# Patient Record
Sex: Female | Born: 1995 | Race: White | Hispanic: No | Marital: Single | Smoking: Former smoker
Health system: Southern US, Community
[De-identification: ages and names within clinical notes are randomized; demographics above are authoritative.]

## PROBLEM LIST (undated history)

## (undated) ENCOUNTER — Inpatient Hospital Stay (HOSPITAL_COMMUNITY): Payer: Self-pay

## (undated) DIAGNOSIS — F99 Mental disorder, not otherwise specified: Secondary | ICD-10-CM

## (undated) DIAGNOSIS — G43909 Migraine, unspecified, not intractable, without status migrainosus: Secondary | ICD-10-CM

## (undated) DIAGNOSIS — F32A Depression, unspecified: Secondary | ICD-10-CM

## (undated) DIAGNOSIS — F419 Anxiety disorder, unspecified: Secondary | ICD-10-CM

## (undated) DIAGNOSIS — O24419 Gestational diabetes mellitus in pregnancy, unspecified control: Secondary | ICD-10-CM

## (undated) HISTORY — PX: NO PAST SURGERIES: SHX2092

## (undated) HISTORY — DX: Mental disorder, not otherwise specified: F99

---
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
## 2016-06-15 NOTE — L&D Delivery Note (Signed)
Delivery Note Pt became complete at 2100 and pushed off and on with varying effectiveness, and at 12:06 AM a viable female was delivered via Vaginal, Spontaneous Delivery (Presentation: OA ). Nuchal x 1 reduced prior to delivery.  APGAR: 9, 9; weight: pending.  Infant dried and lifted to pt's abd; cord clamped and cut by FOB. Hospital cord blood sample collected. Placenta status: spont , intact .  Cord: 3 vessel  Anesthesia:  Epidural Episiotomy:  None Lacerations:  None Est. Blood Loss (mL):  100  Mom to postpartum.  Baby to Couplet care / Skin to Skin.  Cam HaiSHAW, [place]hower CNM 02/13/2017, 12:23 AM

## 2016-08-20 ENCOUNTER — Encounter: Payer: Self-pay | Admitting: Obstetrics & Gynecology

## 2016-08-20 ENCOUNTER — Ambulatory Visit (INDEPENDENT_AMBULATORY_CARE_PROVIDER_SITE_OTHER): Payer: Medicaid Other | Admitting: Obstetrics & Gynecology

## 2016-08-20 ENCOUNTER — Other Ambulatory Visit (HOSPITAL_COMMUNITY)
Admission: RE | Admit: 2016-08-20 | Discharge: 2016-08-20 | Disposition: A | Discharge status: Home / Self Care | Payer: Medicaid Other | Source: Ambulatory Visit | Attending: Obstetrics & Gynecology | Admitting: Obstetrics & Gynecology

## 2016-08-20 DIAGNOSIS — E669 Obesity, unspecified: Secondary | ICD-10-CM

## 2016-08-20 DIAGNOSIS — O99210 Obesity complicating pregnancy, unspecified trimester: Secondary | ICD-10-CM | POA: Insufficient documentation

## 2016-08-20 DIAGNOSIS — Z3689 Encounter for other specified antenatal screening: Secondary | ICD-10-CM

## 2016-08-20 DIAGNOSIS — Z01419 Encounter for gynecological examination (general) (routine) without abnormal findings: Secondary | ICD-10-CM | POA: Diagnosis not present

## 2016-08-20 DIAGNOSIS — Z3402 Encounter for supervision of normal first pregnancy, second trimester: Secondary | ICD-10-CM | POA: Diagnosis not present

## 2016-08-20 DIAGNOSIS — O99212 Obesity complicating pregnancy, second trimester: Secondary | ICD-10-CM

## 2016-08-20 DIAGNOSIS — Z113 Encounter for screening for infections with a predominantly sexual mode of transmission: Secondary | ICD-10-CM | POA: Insufficient documentation

## 2016-08-20 DIAGNOSIS — O0993 Supervision of high risk pregnancy, unspecified, third trimester: Secondary | ICD-10-CM | POA: Insufficient documentation

## 2016-08-20 NOTE — Progress Notes (Signed)
  Subjective:    Patricia Wyatt is a 21 yo SW  G1P0 4531w1d being seen today for her first obstetrical visit.  Her obstetrical history is significant for obesity. Patient does intend to breast feed. Pregnancy history fully reviewed.  Patient reports no complaints.  Vitals:   08/20/16 1100 08/20/16 1102  BP: 125/87   Pulse: 87   Weight: 201 lb (91.2 kg)   Height:  5\' 8"  (1.727 m)    HISTORY: OB History  Gravida Para Term Preterm AB Living  1            SAB TAB Ectopic Multiple Live Births               # Outcome Date GA Lbr Len/2nd Weight Sex Delivery Anes PTL Lv  1 Current              History reviewed. No pertinent past medical history. History reviewed. No pertinent surgical history. Family History  Problem Relation Age of Onset  . Lung cancer Maternal Grandfather      Exam    Uterus:     Pelvic Exam:    Perineum: No Hemorrhoids   Vulva: normal   Vagina:  normal mucosa   pH:    Cervix: anteverted   Adnexa: normal adnexa   Bony Pelvis: android  System: Breast:  normal appearance, no masses or tenderness   Skin: normal coloration and turgor, no rashes    Neurologic: oriented   Extremities: normal strength, tone, and muscle mass   HEENT PERRLA   Mouth/Teeth mucous membranes moist, pharynx normal without lesions   Neck supple   Cardiovascular: regular rate and rhythm   Respiratory:  appears well, vitals normal, no respiratory distress, acyanotic, normal RR, ear and throat exam is normal, neck free of mass or lymphadenopathy, chest clear, no wheezing, crepitations, rhonchi, normal symmetric air entry   Abdomen: soft, non-tender; bowel sounds normal; no masses,  no organomegaly   Urinary: urethral meatus normal      Assessment:    Pregnancy: G1P0 Patient Active Problem List   Diagnosis Date Noted  . Supervision of normal first pregnancy 08/20/2016  . Obesity in pregnancy 08/20/2016        Plan:     Initial labs drawn. Prenatal vitamins. Problem list  reviewed and updated. Genetic Screening discussed Quad Screen: requested.  Ultrasound discussed; fetal survey: ordered.  Follow up in 4 weeks. Baby scripts offered Patricia Wyatt 08/20/2016

## 2016-08-20 NOTE — Progress Notes (Signed)
Abdominal US performed, SIUP noted with + FHR = 158, HC measuring 14w 3d which correlates with LMP.

## 2016-08-20 NOTE — Addendum Note (Signed)
Addended by: Allie BossierVE, Terris Germano C on: 08/20/2016 11:33 AM   Modules accepted: Kipp BroodSmartSet

## 2016-08-21 ENCOUNTER — Encounter: Payer: Self-pay | Admitting: *Deleted

## 2016-08-22 LAB — CULTURE, OB URINE

## 2016-08-22 LAB — URINE CULTURE, OB REFLEX: Organism ID, Bacteria: NO GROWTH

## 2016-08-24 LAB — CYTOLOGY - PAP
Chlamydia: NEGATIVE
Diagnosis: NEGATIVE
Neisseria Gonorrhea: NEGATIVE

## 2016-08-28 ENCOUNTER — Encounter (HOSPITAL_COMMUNITY): Payer: Self-pay | Admitting: *Deleted

## 2016-08-28 ENCOUNTER — Inpatient Hospital Stay (HOSPITAL_COMMUNITY)
Admission: AD | Admit: 2016-08-28 | Discharge: 2016-08-28 | Disposition: A | Discharge status: Home / Self Care | Payer: Medicaid Other | Source: Ambulatory Visit | Attending: Family Medicine | Admitting: Family Medicine

## 2016-08-28 DIAGNOSIS — O99332 Smoking (tobacco) complicating pregnancy, second trimester: Secondary | ICD-10-CM | POA: Insufficient documentation

## 2016-08-28 DIAGNOSIS — Z3A15 15 weeks gestation of pregnancy: Secondary | ICD-10-CM | POA: Diagnosis not present

## 2016-08-28 DIAGNOSIS — J029 Acute pharyngitis, unspecified: Secondary | ICD-10-CM | POA: Diagnosis present

## 2016-08-28 DIAGNOSIS — B349 Viral infection, unspecified: Secondary | ICD-10-CM

## 2016-08-28 DIAGNOSIS — O98512 Other viral diseases complicating pregnancy, second trimester: Secondary | ICD-10-CM | POA: Diagnosis not present

## 2016-08-28 DIAGNOSIS — R509 Fever, unspecified: Secondary | ICD-10-CM | POA: Diagnosis present

## 2016-08-28 LAB — URINALYSIS, ROUTINE W REFLEX MICROSCOPIC
Bilirubin Urine: NEGATIVE
Glucose, UA: NEGATIVE mg/dL
HGB URINE DIPSTICK: NEGATIVE
Ketones, ur: 80 mg/dL — AB
Leukocytes, UA: NEGATIVE
Nitrite: NEGATIVE
PH: 6 (ref 5.0–8.0)
Protein, ur: NEGATIVE mg/dL
Specific Gravity, Urine: 1.018 (ref 1.005–1.030)

## 2016-08-28 MED ORDER — PROMETHAZINE HCL 12.5 MG PO TABS: 12.5000 mg | ORAL_TABLET | Freq: Four times a day (QID) | ORAL | 0 refills | Status: AC | PRN

## 2016-08-28 NOTE — MAU Note (Signed)
Pt presents to MAU with complaints of fever, sore throat, lower abdominal cramping since yesterday. Denies any vaginal bleeding or abnormal discharge

## 2016-08-28 NOTE — MAU Provider Note (Signed)
History     CSN: [id number]  Arrival date and time: 08/28/16 1452   None     Chief Complaint  Patient presents with  . Fever  . Sore Throat  . Abdominal Pain   Patient is a 21 year old female G1 P0 who presents to the [facility] U at 15 weeks and 2 days with one week of cold symptoms. She reports she started with runny nose congestion and cough. Over the past 2 days the cough is gotten somewhat worse. She reports she had a fever to 100 point something but no fever today. She has not taken any medications. She does report she has some nausea and vomiting which is a little bit more that she's had this point in her pregnancy. She denies any diarrhea. She reports she is able to drink 4-6 bottles of water a day without any significant issues. Her appetite has been lacking a little bit over the last few days. She reports no shortness of breath. She reports no vaginal bleeding.    OB History    Gravida Para Term Preterm AB Living   1             SAB TAB Ectopic Multiple Live Births                  History reviewed. No pertinent past medical history.  History reviewed. No pertinent surgical history.  Family History  Problem Relation Age of Onset  . Lung cancer Maternal Grandfather     Social History  Substance Use Topics  . Smoking status: Light Tobacco Smoker    Types: Cigarettes  . Smokeless tobacco: Never Used     Comment: Stopped smoking when found out about pregnancy  . Alcohol use Yes     Comment: Pt stopeed when she found out she was pregnanct    Allergies:  Allergies  Allergen Reactions  . Chamomile Swelling and Rash    Prescriptions Prior to Admission  Medication Sig Dispense Refill Last Dose  . acetaminophen (TYLENOL) 325 MG tablet Take 650 mg by mouth every 6 (six) hours as needed.   Not Taking  . Prenatal Vit-Fe Fumarate-FA (MULTIVITAMIN-PRENATAL) 27-0.8 MG TABS tablet Take 1 tablet by mouth daily at 12 noon.   Taking    Review of Systems  Constitutional:  Positive for appetite change and fever. Negative for chills.  HENT: Positive for congestion and postnasal drip.   Respiratory: Positive for cough. Negative for choking, chest tightness, shortness of breath and wheezing.   Cardiovascular: Negative for chest pain and palpitations.  Gastrointestinal: Positive for nausea and vomiting. Negative for abdominal distention, abdominal pain, constipation and diarrhea.  Genitourinary: Negative for difficulty urinating, dysuria, flank pain and frequency.  Neurological: Negative for dizziness and weakness.   Physical Exam   Blood pressure 111/62, pulse 89, temperature 97.9 F (36.6 C), resp. rate 18, last menstrual period 05/13/2016, SpO2 99 %.  Physical Exam  Vitals reviewed. Constitutional: She is oriented to person, place, and time. She appears well-developed and well-nourished.  HENT:  Head: Normocephalic and atraumatic.  Eyes: Conjunctivae and EOM are normal. Pupils are equal, round, and reactive to light.  Cardiovascular: Normal rate, regular rhythm, normal heart sounds and intact distal pulses.  Exam reveals no gallop and no friction rub.   No murmur heard. Respiratory: Effort normal and breath sounds normal. No respiratory distress. She has no wheezes. She has no rales.  GI: Soft. Bowel sounds are normal. She exhibits no distension. There is no tenderness.  There is no rebound and no guarding.  Musculoskeletal: Normal range of motion. She exhibits no edema.  Neurological: She is alert and oriented to person, place, and time. No cranial nerve deficit.  Skin: Skin is warm and dry.  Psychiatric: She has a normal mood and affect. Her behavior is normal.    MAU Course  Procedures  MDM In MA U patient underwent thorough examination. Chance of influenza with no fever and normal vital signs very minimal not tested today. Patient had no signs of pneumonia on exam. No signs of sinusitis. Reassured patient that although her symptoms I would, and cold  may be worse during pregnancy there is no treatment. She was given suggestions for symptomatic treatment and list of safe over-the-counter medications in pregnancy.  Assessment and Plan  Viral syndrome: Patient with acute viral syndrome. Advise supportive care. Return if patient is unable to keep fluids down or patient develops a fever that does not respond to Tylenol. Did prescribe Phenergan for the patient when necessary.  Ernestina Pennaicholas Kateline Kinkade 08/28/2016, 3:46 PM

## 2016-08-28 NOTE — Discharge Instructions (Signed)
Viral Illness, Adult Viruses are tiny germs that can get into a person's body and cause illness. There are many different types of viruses, and they cause many types of illness. Viral illnesses can range from mild to severe. They can affect various parts of the body. Common illnesses that are caused by a virus include colds and the flu. Viral illnesses also include serious conditions such as HIV/AIDS (human immunodeficiency virus/acquired immunodeficiency syndrome). A few viruses have been linked to certain cancers. What are the causes? Many types of viruses can cause illness. Viruses invade cells in your body, multiply, and cause the infected cells to malfunction or die. When the cell dies, it releases more of the virus. When this happens, you develop symptoms of the illness, and the virus continues to spread to other cells. If the virus takes over the function of the cell, it can cause the cell to divide and grow out of control, as is the case when a virus causes cancer. Different viruses get into the body in different ways. You can get a virus by:  Swallowing food or water that is contaminated with the virus.  Breathing in droplets that have been coughed or sneezed into the air by an infected person.  Touching a surface that has been contaminated with the virus and then touching your eyes, nose, or mouth.  Being bitten by an insect or animal that carries the virus.  Having sexual contact with a person who is infected with the virus.  Being exposed to blood or fluids that contain the virus, either through an open cut or during a transfusion. If a virus enters your body, your body's defense system (immune system) will try to fight the virus. You may be at higher risk for a viral illness if your immune system is weak. What are the signs or symptoms? Symptoms vary depending on the type of virus and the location of the cells that it invades. Common symptoms of the main types of viral illnesses  include: Cold and flu viruses   Fever.  Headache.  Sore throat.  Muscle aches.  Nasal congestion.  Cough. Digestive system (gastrointestinal) viruses   Fever.  Abdominal pain.  Nausea.  Diarrhea. Liver viruses (hepatitis)   Loss of appetite.  Tiredness.  Yellowing of the skin (jaundice). Brain and spinal cord viruses   Fever.  Headache.  Stiff neck.  Nausea and vomiting.  Confusion or sleepiness. Skin viruses   Warts.  Itching.  Rash. Sexually transmitted viruses   Discharge.  Swelling.  Redness.  Rash. How is this treated? Viruses can be difficult to treat because they live within cells. Antibiotic medicines do not treat viruses because these drugs do not get inside cells. Treatment for a viral illness may include:  Resting and drinking plenty of fluids.  Medicines to relieve symptoms. These can include over-the-counter medicine for pain and fever, medicines for cough or congestion, and medicines to relieve diarrhea.  Antiviral medicines. These drugs are available only for certain types of viruses. They may help reduce flu symptoms if taken early. There are also many antiviral medicines for hepatitis and HIV/AIDS. Some viral illnesses can be prevented with vaccinations. A common example is the flu shot. Follow these instructions at home: Medicines    Take over-the-counter and prescription medicines only as told by your health care provider.  If you were prescribed an antiviral medicine, take it as told by your health care provider. Do not stop taking the medicine even if you start to  feel better.  Be aware of when antibiotics are needed and when they are not needed. Antibiotics do not treat viruses. If your health care provider thinks that you may have a bacterial infection as well as a viral infection, you may get an antibiotic.  Do not ask for an antibiotic prescription if you have been diagnosed with a viral illness. That will not make  your illness go away faster.  Frequently taking antibiotics when they are not needed can lead to antibiotic resistance. When this develops, the medicine no longer works against the bacteria that it normally fights. General instructions   Drink enough fluids to keep your urine clear or pale yellow.  Rest as much as possible.  Return to your normal activities as told by your health care provider. Ask your health care provider what activities are safe for you.  Keep all follow-up visits as told by your health care provider. This is important. How is this prevented? Take these actions to reduce your risk of viral infection:  Eat a healthy diet and get enough rest.  Wash your hands often with soap and water. This is especially important when you are in public places. If soap and water are not available, use hand sanitizer.  Avoid close contact with friends and family who have a viral illness.  If you travel to areas where viral gastrointestinal infection is common, avoid drinking water or eating raw food.  Keep your immunizations up to date. Get a flu shot every year as told by your health care provider.  Do not share toothbrushes, nail clippers, razors, or needles with other people.  Always practice safe sex. Contact a health care provider if:  You have symptoms of a viral illness that do not go away.  Your symptoms come back after going away.  Your symptoms get worse. Get help right away if:  You have trouble breathing.  You have a severe headache or a stiff neck.  You have severe vomiting or abdominal pain. This information is not intended to replace advice given to you by your health care provider. Make sure you discuss any questions you have with your health care provider. Document Released: 10/11/2015 Document Revised: 11/13/2015 Document Reviewed: 10/11/2015 [employer] Interactive Patient Education  2017 [employer]  Safe Medications in Pregnancy   Acne:  Benzoyl  Peroxide  Salicylic Acid   Backache/Headache:  Tylenol: 2 regular strength every 4 hours OR        2 Extra strength every 6 hours   Colds/Coughs/Allergies:  Benadryl (alcohol free) 25 mg every 6 hours as needed  Breath right strips  Claritin  Cepacol throat lozenges  Chloraseptic throat spray  Cold-Eeze- up to three times per day  Cough drops, alcohol free  Flonase (by prescription only)  Guaifenesin  Mucinex  Robitussin DM (plain only, alcohol free)  Saline nasal spray/drops  Sudafed (pseudoephedrine) & Actifed * use only after [redacted] weeks gestation and if you do not have high blood pressure  Tylenol  Vicks Vaporub  Zinc lozenges  Zyrtec   Constipation:  Colace  Ducolax suppositories  Fleet enema  Glycerin suppositories  Metamucil  Milk of magnesia  Miralax  Senokot  Smooth move tea   Diarrhea:  Kaopectate  Imodium A-D   *NO pepto Bismol   Hemorrhoids:  Anusol  Anusol HC  Preparation H  Tucks   Indigestion:  Tums  Maalox  Mylanta  Zantac  Pepcid   Insomnia:  Benadryl (alcohol free) 25mg  every 6 hours as  needed  Tylenol PM  Unisom, no Gelcaps   Leg Cramps:  Tums  MagGel   Nausea/Vomiting:  Bonine  Dramamine  Emetrol  Ginger extract  Sea bands  Meclizine  Nausea medication to take during pregnancy:  Unisom (doxylamine succinate 25 mg tablets) Take one tablet daily at bedtime. If symptoms are not adequately controlled, the dose can be increased to a maximum recommended dose of two tablets daily (1/2 tablet in the morning, 1/2 tablet mid-afternoon and one at bedtime).  Vitamin B6 100mg  tablets. Take one tablet twice a day (up to 200 mg per day).   Skin Rashes:  Aveeno products  Benadryl cream or 25mg  every 6 hours as needed  Calamine Lotion  1% cortisone cream   Yeast infection:  Gyne-lotrimin 7  Monistat 7    **If taking multiple medications, please check labels to avoid duplicating the same active ingredients  **take  medication as directed on the label  ** Do not exceed 4000 mg of tylenol in 24 hours  **Do not take medications that contain aspirin or ibuprofen

## 2016-09-01 LAB — OBSTETRIC PANEL, INCLUDING HIV
Antibody Screen: NEGATIVE
Basophils Absolute: 0 10*3/uL (ref 0.0–0.2)
Basos: 0 %
EOS (ABSOLUTE): 0.1 10*3/uL (ref 0.0–0.4)
Eos: 1 %
HEMOGLOBIN: 12.7 g/dL (ref 11.1–15.9)
HIV Screen 4th Generation wRfx: NONREACTIVE
Hematocrit: 38.1 % (ref 34.0–46.6)
Hepatitis B Surface Ag: NEGATIVE
Immature Grans (Abs): 0 10*3/uL (ref 0.0–0.1)
Immature Granulocytes: 0 %
Lymphocytes Absolute: 2.2 10*3/uL (ref 0.7–3.1)
Lymphs: 24 %
MCH: 27.4 pg (ref 26.6–33.0)
MCHC: 33.3 g/dL (ref 31.5–35.7)
MCV: 82 fL (ref 79–97)
MONOS ABS: 0.6 10*3/uL (ref 0.1–0.9)
Monocytes: 7 %
NEUTROS PCT: 68 %
Neutrophils Absolute: 6.3 10*3/uL (ref 1.4–7.0)
Platelets: 225 10*3/uL (ref 150–379)
RBC: 4.64 x10E6/uL (ref 3.77–5.28)
RDW: 14.1 % (ref 12.3–15.4)
RPR: NONREACTIVE
Rh Factor: POSITIVE
Rubella Antibodies, IGG: 12.2 index (ref >0.99)
WBC: 9.1 10*3/uL (ref 3.4–10.8)

## 2016-09-01 LAB — HEMOGLOBIN A1C
Est. average glucose Bld gHb Est-mCnc: 97 mg/dL
Hgb A1c MFr Bld: 5 % (ref 4.8–5.6)

## 2016-09-01 LAB — HEMOGLOBINOPATHY EVALUATION
HGB A: 97.2 % (ref 96.4–98.8)
HGB C: 0 %
HGB S: 0 %
HGB VARIANT: 0 %
Hemoglobin A2 Quantitation: 2.8 % (ref 1.8–3.2)
Hemoglobin F Quantitation: 0 % (ref 0.0–2.0)

## 2016-09-01 LAB — CYSTIC FIBROSIS MUTATION 97: Interpretation: NOT DETECTED

## 2016-09-01 LAB — GLUCOSE, RANDOM: Glucose: 87 mg/dL (ref 65–99)

## 2016-09-22 ENCOUNTER — Other Ambulatory Visit: Payer: Self-pay | Admitting: Obstetrics & Gynecology

## 2016-09-22 ENCOUNTER — Ambulatory Visit (HOSPITAL_COMMUNITY)
Admission: RE | Admit: 2016-09-22 | Discharge: 2016-09-22 | Disposition: A | Discharge status: Home / Self Care | Payer: Medicaid Other | Source: Ambulatory Visit | Attending: Obstetrics & Gynecology | Admitting: Obstetrics & Gynecology

## 2016-09-22 DIAGNOSIS — Z363 Encounter for antenatal screening for malformations: Secondary | ICD-10-CM | POA: Diagnosis not present

## 2016-09-22 DIAGNOSIS — Z3A18 18 weeks gestation of pregnancy: Secondary | ICD-10-CM | POA: Insufficient documentation

## 2016-09-22 DIAGNOSIS — O99212 Obesity complicating pregnancy, second trimester: Secondary | ICD-10-CM | POA: Diagnosis not present

## 2016-09-22 DIAGNOSIS — Z3402 Encounter for supervision of normal first pregnancy, second trimester: Secondary | ICD-10-CM

## 2016-09-30 ENCOUNTER — Ambulatory Visit (INDEPENDENT_AMBULATORY_CARE_PROVIDER_SITE_OTHER): Payer: Medicaid Other | Admitting: Family Medicine

## 2016-09-30 VITALS — BP 117/73 | HR 82 | Wt 201.0 lb

## 2016-09-30 DIAGNOSIS — Z3402 Encounter for supervision of normal first pregnancy, second trimester: Secondary | ICD-10-CM

## 2016-09-30 NOTE — Patient Instructions (Signed)
 Second Trimester of Pregnancy The second trimester is from week 14 through week 27 (months 4 through 6). The second trimester is often a time when you feel your best. Your body has adjusted to being pregnant, and you begin to feel better physically. Usually, morning sickness has lessened or quit completely, you may have more energy, and you may have an increase in appetite. The second trimester is also a time when the fetus is growing rapidly. At the end of the sixth month, the fetus is about 9 inches long and weighs about 1 pounds. You will likely begin to feel the baby move (quickening) between 16 and 20 weeks of pregnancy. Body changes during your second trimester Your body continues to go through many changes during your second trimester. The changes vary from woman to woman.  Your weight will continue to increase. You will notice your lower abdomen bulging out.  You may begin to get stretch marks on your hips, abdomen, and breasts.  You may develop headaches that can be relieved by medicines. The medicines should be approved by your health care provider.  You may urinate more often because the fetus is pressing on your bladder.  You may develop or continue to have heartburn as a result of your pregnancy.  You may develop constipation because certain hormones are causing the muscles that push waste through your intestines to slow down.  You may develop hemorrhoids or swollen, bulging veins (varicose veins).  You may have back pain. This is caused by: ? Weight gain. ? Pregnancy hormones that are relaxing the joints in your pelvis. ? A shift in weight and the muscles that support your balance.  Your breasts will continue to grow and they will continue to become tender.  Your gums may bleed and may be sensitive to brushing and flossing.  Dark spots or blotches (chloasma, mask of pregnancy) may develop on your face. This will likely fade after the baby is born.  A dark line from  your belly button to the pubic area (linea nigra) may appear. This will likely fade after the baby is born.  You may have changes in your hair. These can include thickening of your hair, rapid growth, and changes in texture. Some women also have hair loss during or after pregnancy, or hair that feels dry or thin. Your hair will most likely return to normal after your baby is born.  What to expect at prenatal visits During a routine prenatal visit:  You will be weighed to make sure you and the fetus are growing normally.  Your blood pressure will be taken.  Your abdomen will be measured to track your baby's growth.  The fetal heartbeat will be listened to.  Any test results from the previous visit will be discussed.  Your health care provider may ask you:  How you are feeling.  If you are feeling the baby move.  If you have had any abnormal symptoms, such as leaking fluid, bleeding, severe headaches, or abdominal cramping.  If you are using any tobacco products, including cigarettes, chewing tobacco, and electronic cigarettes.  If you have any questions.  Other tests that may be performed during your second trimester include:  Blood tests that check for: ? Low iron levels (anemia). ? High blood sugar that affects pregnant women (gestational diabetes) between 24 and 28 weeks. ? Rh antibodies. This is to check for a protein on red blood cells (Rh factor).  Urine tests to check for infections, diabetes,   or protein in the urine.  An ultrasound to confirm the proper growth and development of the baby.  An amniocentesis to check for possible genetic problems.  Fetal screens for spina bifida and Down syndrome.  HIV (human immunodeficiency virus) testing. Routine prenatal testing includes screening for HIV, unless you choose not to have this test.  Follow these instructions at home: Medicines  Follow your health care provider's instructions regarding medicine use. Specific  medicines may be either safe or unsafe to take during pregnancy.  Take a prenatal vitamin that contains at least 600 micrograms (mcg) of folic acid.  If you develop constipation, try taking a stool softener if your health care provider approves. Eating and drinking  Eat a balanced diet that includes fresh fruits and vegetables, whole grains, good sources of protein such as meat, eggs, or tofu, and low-fat dairy. Your health care provider will help you determine the amount of weight gain that is right for you.  Avoid raw meat and uncooked cheese. These carry germs that can cause birth defects in the baby.  If you have low calcium intake from food, talk to your health care provider about whether you should take a daily calcium supplement.  Limit foods that are high in fat and processed sugars, such as fried and sweet foods.  To prevent constipation: ? Drink enough fluid to keep your urine clear or pale yellow. ? Eat foods that are high in fiber, such as fresh fruits and vegetables, whole grains, and beans. Activity  Exercise only as directed by your health care provider. Most women can continue their usual exercise routine during pregnancy. Try to exercise for 30 minutes at least 5 days a week. Stop exercising if you experience uterine contractions.  Avoid heavy lifting, wear low heel shoes, and practice good posture.  A sexual relationship may be continued unless your health care provider directs you otherwise. Relieving pain and discomfort  Wear a good support bra to prevent discomfort from breast tenderness.  Take warm sitz baths to soothe any pain or discomfort caused by hemorrhoids. Use hemorrhoid cream if your health care provider approves.  Rest with your legs elevated if you have leg cramps or low back pain.  If you develop varicose veins, wear support hose. Elevate your feet for 15 minutes, 3-4 times a day. Limit salt in your diet. Prenatal Care  Write down your questions.  Take them to your prenatal visits.  Keep all your prenatal visits as told by your health care provider. This is important. Safety  Wear your seat belt at all times when driving.  Make a list of emergency phone numbers, including numbers for family, friends, the hospital, and police and fire departments. General instructions  Ask your health care provider for a referral to a local prenatal education class. Begin classes no later than the beginning of month 6 of your pregnancy.  Ask for help if you have counseling or nutritional needs during pregnancy. Your health care provider can offer advice or refer you to specialists for help with various needs.  Do not use hot tubs, steam rooms, or saunas.  Do not douche or use tampons or scented sanitary pads.  Do not cross your legs for long periods of time.  Avoid cat litter boxes and soil used by cats. These carry germs that can cause birth defects in the baby and possibly loss of the fetus by miscarriage or stillbirth.  Avoid all smoking, herbs, alcohol, and unprescribed drugs. Chemicals in these products   can affect the formation and growth of the baby.  Do not use any products that contain nicotine or tobacco, such as cigarettes and e-cigarettes. If you need help quitting, ask your health care provider.  Visit your dentist if you have not gone yet during your pregnancy. Use a soft toothbrush to brush your teeth and be gentle when you floss. Contact a health care provider if:  You have dizziness.  You have mild pelvic cramps, pelvic pressure, or nagging pain in the abdominal area.  You have persistent nausea, vomiting, or diarrhea.  You have a bad smelling vaginal discharge.  You have pain when you urinate. Get help right away if:  You have a fever.  You are leaking fluid from your vagina.  You have spotting or bleeding from your vagina.  You have severe abdominal cramping or pain.  You have rapid weight gain or weight  loss.  You have shortness of breath with chest pain.  You notice sudden or extreme swelling of your face, hands, ankles, feet, or legs.  You have not felt your baby move in over an hour.  You have severe headaches that do not go away when you take medicine.  You have vision changes. Summary  The second trimester is from week 14 through week 27 (months 4 through 6). It is also a time when the fetus is growing rapidly.  Your body goes through many changes during pregnancy. The changes vary from woman to woman.  Avoid all smoking, herbs, alcohol, and unprescribed drugs. These chemicals affect the formation and growth your baby.  Do not use any tobacco products, such as cigarettes, chewing tobacco, and e-cigarettes. If you need help quitting, ask your health care provider.  Contact your health care provider if you have any questions. Keep all prenatal visits as told by your health care provider. This is important. This information is not intended to replace advice given to you by your health care provider. Make sure you discuss any questions you have with your health care provider. Document Released: 05/26/2001 Document Revised: 11/07/2015 Document Reviewed: 08/02/2012 [employer] Interactive Patient Education  2017 [employer]   Breastfeeding Deciding to breastfeed is one of the best choices you can make for you and your baby. A change in hormones during pregnancy causes your breast tissue to grow and increases the number and size of your milk ducts. These hormones also allow proteins, sugars, and fats from your blood supply to make breast milk in your milk-producing glands. Hormones prevent breast milk from being released before your baby is born as well as prompt milk flow after birth. Once breastfeeding has begun, thoughts of your baby, as well as his or her sucking or crying, can stimulate the release of milk from your milk-producing glands. Benefits of breastfeeding For Your  Baby  Your first milk (colostrum) helps your baby's digestive system function better.  There are antibodies in your milk that help your baby fight off infections.  Your baby has a lower incidence of asthma, allergies, and sudden infant death syndrome.  The nutrients in breast milk are better for your baby than infant formulas and are designed uniquely for your baby's needs.  Breast milk improves your baby's brain development.  Your baby is less likely to develop other conditions, such as childhood obesity, asthma, or type 2 diabetes mellitus.  For You  Breastfeeding helps to create a very special bond between you and your baby.  Breastfeeding is convenient. Breast milk is always available at   the correct temperature and costs nothing.  Breastfeeding helps to burn calories and helps you lose the weight gained during pregnancy.  Breastfeeding makes your uterus contract to its prepregnancy size faster and slows bleeding (lochia) after you give birth.  Breastfeeding helps to lower your risk of developing type 2 diabetes mellitus, osteoporosis, and breast or ovarian cancer later in life.  Signs that your baby is hungry Early Signs of Hunger  Increased alertness or activity.  Stretching.  Movement of the head from side to side.  Movement of the head and opening of the mouth when the corner of the mouth or cheek is stroked (rooting).  Increased sucking sounds, smacking lips, cooing, sighing, or squeaking.  Hand-to-mouth movements.  Increased sucking of fingers or hands.  Late Signs of Hunger  Fussing.  Intermittent crying.  Extreme Signs of Hunger Signs of extreme hunger will require calming and consoling before your baby will be able to breastfeed successfully. Do not wait for the following signs of extreme hunger to occur before you initiate breastfeeding:  Restlessness.  A loud, strong cry.  Screaming.  Breastfeeding basics Breastfeeding Initiation  Find a  comfortable place to sit or lie down, with your neck and back well supported.  Place a pillow or rolled up blanket under your baby to bring him or her to the level of your breast (if you are seated). Nursing pillows are specially designed to help support your arms and your baby while you breastfeed.  Make sure that your baby's abdomen is facing your abdomen.  Gently massage your breast. With your fingertips, massage from your chest wall toward your nipple in a circular motion. This encourages milk flow. You may need to continue this action during the feeding if your milk flows slowly.  Support your breast with 4 fingers underneath and your thumb above your nipple. Make sure your fingers are well away from your nipple and your baby's mouth.  Stroke your baby's lips gently with your finger or nipple.  When your baby's mouth is open wide enough, quickly bring your baby to your breast, placing your entire nipple and as much of the colored area around your nipple (areola) as possible into your baby's mouth. ? More areola should be visible above your baby's upper lip than below the lower lip. ? Your baby's tongue should be between his or her lower gum and your breast.  Ensure that your baby's mouth is correctly positioned around your nipple (latched). Your baby's lips should create a seal on your breast and be turned out (everted).  It is common for your baby to suck about 2-3 minutes in order to start the flow of breast milk.  Latching Teaching your baby how to latch on to your breast properly is very important. An improper latch can cause nipple pain and decreased milk supply for you and poor weight gain in your baby. Also, if your baby is not latched onto your nipple properly, he or she may swallow some air during feeding. This can make your baby fussy. Burping your baby when you switch breasts during the feeding can help to get rid of the air. However, teaching your baby to latch on properly is  still the best way to prevent fussiness from swallowing air while breastfeeding. Signs that your baby has successfully latched on to your nipple:  Silent tugging or silent sucking, without causing you pain.  Swallowing heard between every 3-4 sucks.  Muscle movement above and in front of his or her   ears while sucking.  Signs that your baby has not successfully latched on to nipple:  Sucking sounds or smacking sounds from your baby while breastfeeding.  Nipple pain.  If you think your baby has not latched on correctly, slip your finger into the corner of your baby's mouth to break the suction and place it between your baby's gums. Attempt breastfeeding initiation again. Signs of Successful Breastfeeding Signs from your baby:  A gradual decrease in the number of sucks or complete cessation of sucking.  Falling asleep.  Relaxation of his or her body.  Retention of a small amount of milk in his or her mouth.  Letting go of your breast by himself or herself.  Signs from you:  Breasts that have increased in firmness, weight, and size 1-3 hours after feeding.  Breasts that are softer immediately after breastfeeding.  Increased milk volume, as well as a change in milk consistency and color by the fifth day of breastfeeding.  Nipples that are not sore, cracked, or bleeding.  Signs That Your Baby is Getting Enough Milk  Wetting at least 1-2 diapers during the first 24 hours after birth.  Wetting at least 5-6 diapers every 24 hours for the first week after birth. The urine should be clear or pale yellow by 5 days after birth.  Wetting 6-8 diapers every 24 hours as your baby continues to grow and develop.  At least 3 stools in a 24-hour period by age 5 days. The stool should be soft and yellow.  At least 3 stools in a 24-hour period by age 7 days. The stool should be seedy and yellow.  No loss of weight greater than 10% of birth weight during the first 3 days of age.  Average  weight gain of 4-7 ounces (113-198 g) per week after age 4 days.  Consistent daily weight gain by age 5 days, without weight loss after the age of 2 weeks.  After a feeding, your baby may spit up a small amount. This is common. Breastfeeding frequency and duration Frequent feeding will help you make more milk and can prevent sore nipples and breast engorgement. Breastfeed when you feel the need to reduce the fullness of your breasts or when your baby shows signs of hunger. This is called "breastfeeding on demand." Avoid introducing a pacifier to your baby while you are working to establish breastfeeding (the first 4-6 weeks after your baby is born). After this time you may choose to use a pacifier. Research has shown that pacifier use during the first year of a baby's life decreases the risk of sudden infant death syndrome (SIDS). Allow your baby to feed on each breast as long as he or she wants. Breastfeed until your baby is finished feeding. When your baby unlatches or falls asleep while feeding from the first breast, offer the second breast. Because newborns are often sleepy in the first few weeks of life, you may need to awaken your baby to get him or her to feed. Breastfeeding times will vary from baby to baby. However, the following rules can serve as a guide to help you ensure that your baby is properly fed:  Newborns (babies 4 weeks of age or younger) may breastfeed every 1-3 hours.  Newborns should not go longer than 3 hours during the day or 5 hours during the night without breastfeeding.  You should breastfeed your baby a minimum of 8 times in a 24-hour period until you begin to introduce solid foods to your   baby at around 6 months of age.  Breast milk pumping Pumping and storing breast milk allows you to ensure that your baby is exclusively fed your breast milk, even at times when you are unable to breastfeed. This is especially important if you are going back to work while you are still  breastfeeding or when you are not able to be present during feedings. Your lactation consultant can give you guidelines on how long it is safe to store breast milk. A breast pump is a machine that allows you to pump milk from your breast into a sterile bottle. The pumped breast milk can then be stored in a refrigerator or freezer. Some breast pumps are operated by hand, while others use electricity. Ask your lactation consultant which type will work best for you. Breast pumps can be purchased, but some hospitals and breastfeeding support groups lease breast pumps on a monthly basis. A lactation consultant can teach you how to hand express breast milk, if you prefer not to use a pump. Caring for your breasts while you breastfeed Nipples can become dry, cracked, and sore while breastfeeding. The following recommendations can help keep your breasts moisturized and healthy:  Avoid using soap on your nipples.  Wear a supportive bra. Although not required, special nursing bras and tank tops are designed to allow access to your breasts for breastfeeding without taking off your entire bra or top. Avoid wearing underwire-style bras or extremely tight bras.  Air dry your nipples for 3-4minutes after each feeding.  Use only cotton bra pads to absorb leaked breast milk. Leaking of breast milk between feedings is normal.  Use lanolin on your nipples after breastfeeding. Lanolin helps to maintain your skin's normal moisture barrier. If you use pure lanolin, you do not need to wash it off before feeding your baby again. Pure lanolin is not toxic to your baby. You may also hand express a few drops of breast milk and gently massage that milk into your nipples and allow the milk to air dry.  In the first few weeks after giving birth, some women experience extremely full breasts (engorgement). Engorgement can make your breasts feel heavy, warm, and tender to the touch. Engorgement peaks within 3-5 days after you give  birth. The following recommendations can help ease engorgement:  Completely empty your breasts while breastfeeding or pumping. You may want to start by applying warm, moist heat (in the shower or with warm water-soaked hand towels) just before feeding or pumping. This increases circulation and helps the milk flow. If your baby does not completely empty your breasts while breastfeeding, pump any extra milk after he or she is finished.  Wear a snug bra (nursing or regular) or tank top for 1-2 days to signal your body to slightly decrease milk production.  Apply ice packs to your breasts, unless this is too uncomfortable for you.  Make sure that your baby is latched on and positioned properly while breastfeeding.  If engorgement persists after 48 hours of following these recommendations, contact your health care provider or a lactation consultant. Overall health care recommendations while breastfeeding  Eat healthy foods. Alternate between meals and snacks, eating 3 of each per day. Because what you eat affects your breast milk, some of the foods may make your baby more irritable than usual. Avoid eating these foods if you are sure that they are negatively affecting your baby.  Drink milk, fruit juice, and water to satisfy your thirst (about 10 glasses a day).    Rest often, relax, and continue to take your prenatal vitamins to prevent fatigue, stress, and anemia.  Continue breast self-awareness checks.  Avoid chewing and smoking tobacco. Chemicals from cigarettes that pass into breast milk and exposure to secondhand smoke may harm your baby.  Avoid alcohol and drug use, including marijuana. Some medicines that may be harmful to your baby can pass through breast milk. It is important to ask your health care provider before taking any medicine, including all over-the-counter and prescription medicine as well as vitamin and herbal supplements. It is possible to become pregnant while breastfeeding.  If birth control is desired, ask your health care provider about options that will be safe for your baby. Contact a health care provider if:  You feel like you want to stop breastfeeding or have become frustrated with breastfeeding.  You have painful breasts or nipples.  Your nipples are cracked or bleeding.  Your breasts are red, tender, or warm.  You have a swollen area on either breast.  You have a fever or chills.  You have nausea or vomiting.  You have drainage other than breast milk from your nipples.  Your breasts do not become full before feedings by the fifth day after you give birth.  You feel sad and depressed.  Your baby is too sleepy to eat well.  Your baby is having trouble sleeping.  Your baby is wetting less than 3 diapers in a 24-hour period.  Your baby has less than 3 stools in a 24-hour period.  Your baby's skin or the white part of his or her eyes becomes yellow.  Your baby is not gaining weight by 5 days of age. Get help right away if:  Your baby is overly tired (lethargic) and does not want to wake up and feed.  Your baby develops an unexplained fever. This information is not intended to replace advice given to you by your health care provider. Make sure you discuss any questions you have with your health care provider. Document Released: 06/01/2005 Document Revised: 11/13/2015 Document Reviewed: 11/23/2012 [employer] Interactive Patient Education  2017 [employer]  

## 2016-10-01 NOTE — Progress Notes (Signed)
   PRENATAL VISIT NOTE  Subjective:  Patricia Wyatt is a 21 y.o. G1P0 at [redacted]w[redacted]d being seen today for ongoing prenatal care.  She is currently monitored for the following issues for this low-risk pregnancy and has Supervision of normal first pregnancy and Obesity in pregnancy on her problem list.  Patient reports hand numbness.  Contractions: Not present. Vag. Bleeding: None.  Movement: Present. Denies leaking of fluid.   The following portions of the patient's history were reviewed and updated as appropriate: allergies, current medications, past family history, past medical history, past social history, past surgical history and problem list. Problem list updated.  Objective:   Vitals:   09/30/16 0950  BP: 117/73  Pulse: 82  Weight: 201 lb (91.2 kg)    Fetal Status: Fetal Heart Rate (bpm): 151   Movement: Present     General:  Alert, oriented and cooperative. Patient is in no acute distress.  Skin: Skin is warm and dry. No rash noted.   Cardiovascular: Normal heart rate noted  Respiratory: Normal respiratory effort, no problems with respiration noted  Abdomen: Soft, gravid, appropriate for gestational age. Pain/Pressure: Absent     Pelvic:  Cervical exam deferred        Extremities: Normal range of motion.  Edema: None  Mental Status: Normal mood and affect. Normal behavior. Normal judgment and thought content.   Assessment and Plan:  Pregnancy: G1P0 at [redacted]w[redacted]d  1. Encounter for supervision of normal first pregnancy in second trimester Continue routine prenatal care. 28 wk labs next visit   Preterm labor symptoms and general obstetric precautions including but not limited to vaginal bleeding, contractions, leaking of fluid and fetal movement were reviewed in detail with the patient. Please refer to After Visit Summary for other counseling recommendations.  Return in about 8 weeks (around 11/25/2016).   Reva Bores, MD

## 2016-10-08 ENCOUNTER — Inpatient Hospital Stay (HOSPITAL_COMMUNITY)
Admission: AD | Admit: 2016-10-08 | Discharge: 2016-10-08 | Disposition: A | Discharge status: Home / Self Care | Payer: Medicaid Other | Source: Ambulatory Visit | Attending: Obstetrics & Gynecology | Admitting: Obstetrics & Gynecology

## 2016-10-08 ENCOUNTER — Encounter (HOSPITAL_COMMUNITY): Payer: Self-pay | Admitting: *Deleted

## 2016-10-08 DIAGNOSIS — Z3A21 21 weeks gestation of pregnancy: Secondary | ICD-10-CM | POA: Insufficient documentation

## 2016-10-08 DIAGNOSIS — Z87891 Personal history of nicotine dependence: Secondary | ICD-10-CM | POA: Insufficient documentation

## 2016-10-08 DIAGNOSIS — O26899 Other specified pregnancy related conditions, unspecified trimester: Secondary | ICD-10-CM

## 2016-10-08 DIAGNOSIS — R109 Unspecified abdominal pain: Secondary | ICD-10-CM | POA: Diagnosis present

## 2016-10-08 DIAGNOSIS — K5900 Constipation, unspecified: Secondary | ICD-10-CM | POA: Diagnosis not present

## 2016-10-08 DIAGNOSIS — O26892 Other specified pregnancy related conditions, second trimester: Secondary | ICD-10-CM | POA: Diagnosis not present

## 2016-10-08 DIAGNOSIS — R030 Elevated blood-pressure reading, without diagnosis of hypertension: Secondary | ICD-10-CM | POA: Insufficient documentation

## 2016-10-08 DIAGNOSIS — O99612 Diseases of the digestive system complicating pregnancy, second trimester: Secondary | ICD-10-CM | POA: Insufficient documentation

## 2016-10-08 DIAGNOSIS — R102 Pelvic and perineal pain: Secondary | ICD-10-CM | POA: Diagnosis not present

## 2016-10-08 LAB — PROTEIN / CREATININE RATIO, URINE
Creatinine, Urine: 280 mg/dL
PROTEIN CREATININE RATIO: 0.09 mg/mg{creat} (ref 0.00–0.15)
Total Protein, Urine: 25 mg/dL

## 2016-10-08 LAB — CBC
HEMATOCRIT: 34.9 % — AB (ref 36.0–46.0)
Hemoglobin: 11.5 g/dL — ABNORMAL LOW (ref 12.0–15.0)
MCH: 28.3 pg (ref 26.0–34.0)
MCHC: 33 g/dL (ref 30.0–36.0)
MCV: 85.7 fL (ref 78.0–100.0)
Platelets: 192 10*3/uL (ref 150–400)
RBC: 4.07 MIL/uL (ref 3.87–5.11)
RDW: 14 % (ref 11.5–15.5)
WBC: 12.4 10*3/uL — ABNORMAL HIGH (ref 4.0–10.5)

## 2016-10-08 LAB — COMPREHENSIVE METABOLIC PANEL
ALBUMIN: 3.1 g/dL — AB (ref 3.5–5.0)
ALT: 14 U/L (ref 14–54)
ANION GAP: 8 (ref 5–15)
AST: 22 U/L (ref 15–41)
Alkaline Phosphatase: 71 U/L (ref 38–126)
BILIRUBIN TOTAL: 0.5 mg/dL (ref 0.3–1.2)
BUN: 7 mg/dL (ref 6–20)
CHLORIDE: 105 mmol/L (ref 101–111)
CO2: 22 mmol/L (ref 22–32)
Calcium: 9.1 mg/dL (ref 8.9–10.3)
Creatinine, Ser: 0.45 mg/dL (ref 0.44–1.00)
GFR calc Af Amer: >60 mL/min (ref >60)
GFR calc non Af Amer: >60 mL/min (ref >60)
GLUCOSE: 80 mg/dL (ref 65–99)
POTASSIUM: 3.8 mmol/L (ref 3.5–5.1)
SODIUM: 135 mmol/L (ref 135–145)
Total Protein: 6.7 g/dL (ref 6.5–8.1)

## 2016-10-08 LAB — URINALYSIS, ROUTINE W REFLEX MICROSCOPIC
Bilirubin Urine: NEGATIVE
Glucose, UA: NEGATIVE mg/dL
Hgb urine dipstick: NEGATIVE
KETONES UR: NEGATIVE mg/dL
Leukocytes, UA: NEGATIVE
Nitrite: NEGATIVE
Protein, ur: 30 mg/dL — AB
Specific Gravity, Urine: 1.024 (ref 1.005–1.030)
pH: 6 (ref 5.0–8.0)

## 2016-10-08 MED ORDER — COMFORT FIT MATERNITY SUPP SM MISC: 1.0000 [IU] | Freq: Every day | 0 refills | Status: AC | PRN

## 2016-10-08 MED ORDER — ACETAMINOPHEN 500 MG PO TABS
1000.0000 mg | ORAL_TABLET | Freq: Once | ORAL | Status: AC
  Administered 2016-10-08: 1000 mg via ORAL
  Filled 2016-10-08: qty 2

## 2016-10-08 NOTE — MAU Provider Note (Signed)
History     CSN: [id number]  Arrival date and time: 10/08/16 1127  First Provider Initiated Contact with Patient 10/08/16 [phone]      Chief Complaint  Patient presents with  . Abdominal Pain   HPI Patricia Wyatt is a 21 y.o. G1P0 at [redacted]w[redacted]d who presents with abdominal pain. Symptoms began 2 days ago. Reports lower abdominal cramping that is constant. Pain worse with with walking & movement. Rates pain 4/10. Has not treated. Denies n/v/d, vaginal bleeding, LOF, dysuria. Last intercourse 2 days ago. Last BM 2 days ago; issues with constipation during this pregnancy, has not treated.  No history of hypertension. Denies headache, visual disturbance, or epigastric pain.   OB History    Gravida Para Term Preterm AB Living   1             SAB TAB Ectopic Multiple Live Births                  Past Medical History:  Diagnosis Date  . Medical history non-contributory     Past Surgical History:  Procedure Laterality Date  . NO PAST SURGERIES      Family History  Problem Relation Age of Onset  . Lung cancer Maternal Grandfather     Social History  Substance Use Topics  . Smoking status: Former Smoker    Types: Cigarettes    Quit date: 07/16/2016  . Smokeless tobacco: Never Used     Comment: Stopped smoking when found out about pregnancy  . Alcohol use Yes     Comment: Pt stopeed when she found out she was pregnanct    Allergies:  Allergies  Allergen Reactions  . Chamomile Swelling and Rash    Prescriptions Prior to Admission  Medication Sig Dispense Refill Last Dose  . acetaminophen (TYLENOL) 325 MG tablet Take 650 mg by mouth every 6 (six) hours as needed.   Taking  . Prenatal Vit-Fe Fumarate-FA (MULTIVITAMIN-PRENATAL) 27-0.8 MG TABS tablet Take 1 tablet by mouth daily at 12 noon.   Taking  . promethazine (PHENERGAN) 12.5 MG tablet Take 1 tablet (12.5 mg total) by mouth every 6 (six) hours as needed for nausea or vomiting. 30 tablet 0 Taking    Review of Systems   Constitutional: Negative.   Eyes: Negative for visual disturbance.  Gastrointestinal: Positive for abdominal pain and constipation. Negative for diarrhea, nausea and vomiting.  Genitourinary: Negative.   Neurological: Negative for headaches.   Physical Exam   Blood pressure 121/67, pulse 82, temperature 98.6 F (37 C), temperature source Oral, resp. rate 18, height  (1.727 m), weight 198 lb 1.9 oz (89.9 kg), last menstrual period 05/13/2016.  Temp:  [98.6 F (37 C)] 98.6 F (37 C) (04/26 1144) Pulse Rate:  [82-96] 82 (04/26 1646) Resp:  [18] 18 (04/26 1646) BP: (121-140)/(67-87) 121/67 (04/26 1646) Weight:  [198 lb 1.9 oz (89.9 kg)] 198 lb 1.9 oz (89.9 kg) (04/26 1144)  Physical Exam  Nursing note and vitals reviewed. Constitutional: She is oriented to person, place, and time. She appears well-developed and well-nourished. No distress.  HENT:  Head: Normocephalic and atraumatic.  Eyes: Conjunctivae are normal. Right eye exhibits no discharge. Left eye exhibits no discharge. No scleral icterus.  Neck: Normal range of motion.  Cardiovascular: Normal rate, regular rhythm and normal heart sounds.   No murmur heard. Respiratory: Effort normal and breath sounds normal. No respiratory distress. She has no wheezes.  GI: Soft. Bowel sounds are normal. There is no tenderness.  Musculoskeletal: She exhibits no edema.  Neurological: She is alert and oriented to person, place, and time. She has normal reflexes.  Skin: Skin is warm and dry. She is not diaphoretic.  Psychiatric: She has a normal mood and affect. Her behavior is normal. Judgment and thought content normal.   Dilation: Closed Effacement (%): Thick Cervical Position: Posterior Exam by:: E.Jerney Baksh,NP  MAU Course  Procedures Results for orders placed or performed during the hospital encounter of 10/08/16 (from the past 24 hour(s))  Urinalysis, Routine w reflex microscopic     Status: Abnormal   Collection Time:  10/08/16 11:50 AM  Result Value Ref Range   Color, Urine AMBER (A) YELLOW   APPearance CLOUDY (A) CLEAR   Specific Gravity, Urine 1.024 1.005 - 1.030   pH 6.0 5.0 - 8.0   Glucose, UA NEGATIVE NEGATIVE mg/dL   Hgb urine dipstick NEGATIVE NEGATIVE   Bilirubin Urine NEGATIVE NEGATIVE   Ketones, ur NEGATIVE NEGATIVE mg/dL   Protein, ur 30 (A) NEGATIVE mg/dL   Nitrite NEGATIVE NEGATIVE   Leukocytes, UA NEGATIVE NEGATIVE   RBC / HPF 0-5 0 - 5 RBC/hpf   WBC, UA 6-30 0 - 5 WBC/hpf   Bacteria, UA MANY (A) NONE SEEN   Squamous Epithelial / LPF 6-30 (A) NONE SEEN   Mucous PRESENT   Protein / creatinine ratio, urine     Status: None   Collection Time: 10/08/16 11:50 AM  Result Value Ref Range   Creatinine, Urine 280.00 mg/dL   Total Protein, Urine 25 mg/dL   Protein Creatinine Ratio 0.09 0.00 - 0.15 mg/mg[Cre]  CBC     Status: Abnormal   Collection Time: 10/08/16  3:17 PM  Result Value Ref Range   WBC 12.4 (H) 4.0 - 10.5 K/uL   RBC 4.07 3.87 - 5.11 MIL/uL   Hemoglobin 11.5 (L) 12.0 - 15.0 g/dL   HCT 47.8 (L) 29.5 - 62.1 %   MCV 85.7 78.0 - 100.0 fL   MCH 28.3 26.0 - 34.0 pg   MCHC 33.0 30.0 - 36.0 g/dL   RDW 30.8 65.7 - 84.6 %   Platelets 192 150 - 400 K/uL  Comprehensive metabolic panel     Status: Abnormal   Collection Time: 10/08/16  3:17 PM  Result Value Ref Range   Sodium 135 135 - 145 mmol/L   Potassium 3.8 3.5 - 5.1 mmol/L   Chloride 105 101 - 111 mmol/L   CO2 22 22 - 32 mmol/L   Glucose, Bld 80 65 - 99 mg/dL   BUN 7 6 - 20 mg/dL   Creatinine, Ser 9.62 0.44 - 1.00 mg/dL   Calcium 9.1 8.9 - 95.2 mg/dL   Total Protein 6.7 6.5 - 8.1 g/dL   Albumin 3.1 (L) 3.5 - 5.0 g/dL   AST 22 15 - 41 U/L   ALT 14 14 - 54 U/L   Alkaline Phosphatase 71 38 - 126 U/L   Total Bilirubin 0.5 0.3 - 1.2 mg/dL   GFR calc non Af Amer >60 >60 mL/min   GFR calc Af Amer >60 >60 mL/min   Anion gap 8 5 - 15    MDM FHT 158 Initial BP elevated -- f/u BPs normotensive -- will collect labs PIH  labs wnl Tylenol 1 gm PO Cervix closed Assessment and Plan  A: 1. Pain of round ligament affecting pregnancy, antepartum   2. Elevated BP without diagnosis of hypertension    P: Discharge home Rx maternity support belt Discussed reasons to  return to MAU Keep f/u with OB Discussed tx of constipation at home  Judeth Horn 10/08/2016, 3:00 PM

## 2016-10-08 NOTE — MAU Note (Signed)
Pt reports she has been having abd cramping on and off since yesterday. Hurts real bad when walking.  Denies vaginal bleeding. Having some white vag discharge

## 2016-10-08 NOTE — Discharge Instructions (Signed)
Round Ligament Pain The round ligament is a cord of muscle and tissue that helps to support the uterus. It can become a source of pain during pregnancy if it becomes stretched or twisted as the baby grows. The pain usually begins in the second trimester of pregnancy, and it can come and go until the baby is delivered. It is not a serious problem, and it does not cause harm to the baby. Round ligament pain is usually a short, sharp, and pinching pain, but it can also be a dull, lingering, and aching pain. The pain is felt in the lower side of the abdomen or in the groin. It usually starts deep in the groin and moves up to the outside of the hip area. Pain can occur with:  A sudden change in position.  Rolling over in bed.  Coughing or sneezing.  Physical activity. Follow these instructions at home: Watch your condition for any changes. Take these steps to help with your pain:  When the pain starts, relax. Then try:  Sitting down.  Flexing your knees up to your abdomen.  Lying on your side with one pillow under your abdomen and another pillow between your legs.  Sitting in a warm bath for 15-20 minutes or until the pain goes away.  Take over-the-counter and prescription medicines only as told by your health care provider.  Move slowly when you sit and stand.  Avoid long walks if they cause pain.  Stop or lessen your physical activities if they cause pain. Contact a health care provider if:  Your pain does not go away with treatment.  You feel pain in your back that you did not have before.  Your medicine is not helping. Get help right away if:  You develop a fever or chills.  You develop uterine contractions.  You develop vaginal bleeding.  You develop nausea or vomiting.  You develop diarrhea.  You have pain when you urinate. This information is not intended to replace advice given to you by your health care provider. Make sure you discuss any questions you have with  your health care provider. Document Released: 03/10/2008 Document Revised: 11/07/2015 Document Reviewed: 08/08/2014 Elsevier Interactive Patient Education  2017 [employer]  

## 2016-11-23 ENCOUNTER — Other Ambulatory Visit: Payer: Medicaid Other

## 2016-11-25 ENCOUNTER — Encounter: Payer: Medicaid Other | Admitting: Family Medicine

## 2016-12-04 ENCOUNTER — Ambulatory Visit (INDEPENDENT_AMBULATORY_CARE_PROVIDER_SITE_OTHER): Payer: Medicaid Other | Admitting: Obstetrics and Gynecology

## 2016-12-04 ENCOUNTER — Other Ambulatory Visit (INDEPENDENT_AMBULATORY_CARE_PROVIDER_SITE_OTHER): Payer: Medicaid Other | Admitting: *Deleted

## 2016-12-04 VITALS — BP 115/77 | HR 93 | Wt 207.0 lb

## 2016-12-04 DIAGNOSIS — Z3402 Encounter for supervision of normal first pregnancy, second trimester: Secondary | ICD-10-CM

## 2016-12-04 DIAGNOSIS — Z23 Encounter for immunization: Secondary | ICD-10-CM

## 2016-12-04 DIAGNOSIS — Z3403 Encounter for supervision of normal first pregnancy, third trimester: Secondary | ICD-10-CM | POA: Diagnosis not present

## 2016-12-04 NOTE — Progress Notes (Signed)
Prenatal Visit Note Date: 12/04/2016 Clinic: [facility]  Subjective:  Patricia Wyatt is a 21 y.o. G1P0 at 5742w2d being seen today for ongoing prenatal care.  She is currently monitored for the following issues for this low-risk pregnancy and has Supervision of normal first pregnancy and Obesity in pregnancy on her problem list.  Patient reports no complaints.   Contractions: Not present. Vag. Bleeding: None.  Movement: Present. Denies leaking of fluid.   The following portions of the patient's history were reviewed and updated as appropriate: allergies, current medications, past family history, past medical history, past social history, past surgical history and problem list. Problem list updated.  Objective:   Vitals:   12/04/16 0952  BP: 115/77  Pulse: 93  Weight: 207 lb (93.9 kg)    Fetal Status: Fetal Heart Rate (bpm): 141 Fundal Height: 29 cm Movement: Present     General:  Alert, oriented and cooperative. Patient is in no acute distress.  Skin: Skin is warm and dry. No rash noted.   Cardiovascular: Normal heart rate noted  Respiratory: Normal respiratory effort, no problems with respiration noted  Abdomen: Soft, gravid, appropriate for gestational age. Pain/Pressure: Absent     Pelvic:  Cervical exam deferred        Extremities: Normal range of motion.  Edema: None  Mental Status: Normal mood and affect. Normal behavior. Normal judgment and thought content.   Urinalysis:      Assessment and Plan:  Pregnancy: G1P0 at 4842w2d  1. Encounter for supervision of normal first pregnancy in third trimester Routine care. tdap and 28wk labs today  Preterm labor symptoms and general obstetric precautions including but not limited to vaginal bleeding, contractions, leaking of fluid and fetal movement were reviewed in detail with the patient. Please refer to After Visit Summary for other counseling recommendations.  Return for per baby scripts.   [place] BingPickens, Kymari Lollis,  MD

## 2016-12-05 LAB — CBC
HEMATOCRIT: 32.6 % — AB (ref 34.0–46.6)
HEMOGLOBIN: 10.8 g/dL — AB (ref 11.1–15.9)
MCH: 27.5 pg (ref 26.6–33.0)
MCHC: 33.1 g/dL (ref 31.5–35.7)
MCV: 83 fL (ref 79–97)
Platelets: 197 10*3/uL (ref 150–379)
RBC: 3.93 x10E6/uL (ref 3.77–5.28)
RDW: 13.4 % (ref 12.3–15.4)
WBC: 12.6 10*3/uL — ABNORMAL HIGH (ref 3.4–10.8)

## 2016-12-05 LAB — RPR: RPR Ser Ql: NONREACTIVE

## 2016-12-05 LAB — HIV ANTIBODY (ROUTINE TESTING W REFLEX): HIV SCREEN 4TH GENERATION: NONREACTIVE

## 2016-12-05 LAB — GLUCOSE TOLERANCE, 2 HOURS W/ 1HR
GLUCOSE, 1 HOUR: 129 mg/dL (ref 65–179)
GLUCOSE, FASTING: 94 mg/dL — AB (ref 65–91)
Glucose, 2 hour: 125 mg/dL (ref 65–152)

## 2016-12-06 ENCOUNTER — Encounter: Payer: Self-pay | Admitting: Obstetrics and Gynecology

## 2016-12-06 DIAGNOSIS — O24419 Gestational diabetes mellitus in pregnancy, unspecified control: Secondary | ICD-10-CM | POA: Insufficient documentation

## 2016-12-06 HISTORY — DX: Gestational diabetes mellitus in pregnancy, unspecified control: O24.419

## 2016-12-07 ENCOUNTER — Telehealth: Payer: Self-pay | Admitting: *Deleted

## 2016-12-07 ENCOUNTER — Encounter: Payer: Self-pay | Admitting: *Deleted

## 2016-12-07 DIAGNOSIS — O24419 Gestational diabetes mellitus in pregnancy, unspecified control: Secondary | ICD-10-CM

## 2016-12-07 MED ORDER — GLUCOSE BLOOD VI STRP: ORAL_STRIP | 12 refills | Status: DC

## 2016-12-07 MED ORDER — ACCU-CHEK FASTCLIX LANCETS MISC: 1.0000 [IU] | Freq: Four times a day (QID) | 12 refills | Status: DC

## 2016-12-07 MED ORDER — ACCU-CHEK NANO SMARTVIEW W/DEVICE KIT: 1.0000 | PACK | 0 refills | Status: DC

## 2016-12-07 NOTE — Telephone Encounter (Signed)
-----   Message from [place] Bingharlie Pickens, MD sent at 12/06/2016  9:10 PM EDT ----- She has gdm. Please set her up per protocol. thanks

## 2016-12-08 MED ORDER — ACCU-CHEK AVIVA CONNECT W/DEVICE KIT: 1.0000 | PACK | Freq: Four times a day (QID) | 0 refills | Status: DC

## 2016-12-08 MED ORDER — GLUCOSE BLOOD VI STRP
ORAL_STRIP | 12 refills | Status: DC
Start: 2016-12-08 — End: 2017-02-15

## 2016-12-08 NOTE — Telephone Encounter (Signed)
Corrected Rx [employer]- Insurance only pays for [employer]

## 2016-12-08 NOTE — Addendum Note (Signed)
Addended by: Arne ClevelandHUTCHINSON, MANDY J on: 12/08/2016 08:46 AM   Modules accepted: Orders

## 2016-12-11 MED ORDER — GLUCOSE BLOOD VI STRP: ORAL_STRIP | 12 refills | Status: DC

## 2016-12-11 MED ORDER — ACCU-CHEK AVIVA PLUS W/DEVICE KIT: 1.0000 | PACK | Freq: Four times a day (QID) | 0 refills | Status: DC

## 2016-12-11 NOTE — Addendum Note (Signed)
Addended by: Arne ClevelandHUTCHINSON, MANDY J on: 12/11/2016 11:10 AM   Modules accepted: Orders

## 2016-12-17 ENCOUNTER — Telehealth: Payer: Self-pay | Admitting: *Deleted

## 2016-12-17 DIAGNOSIS — O24410 Gestational diabetes mellitus in pregnancy, diet controlled: Secondary | ICD-10-CM

## 2016-12-17 MED ORDER — ACCU-CHEK SOFTCLIX LANCETS MISC: 12 refills | Status: DC

## 2016-12-17 NOTE — Telephone Encounter (Signed)
Received fax from pharmacy requesting to change the lancets to the accu-chek soft clix to match her meter.  New rx sent to pharmacy.

## 2016-12-23 ENCOUNTER — Ambulatory Visit: Payer: Medicaid Other

## 2016-12-28 ENCOUNTER — Encounter: Payer: Medicaid Other | Admitting: Obstetrics and Gynecology

## 2016-12-30 ENCOUNTER — Encounter: Payer: Medicaid Other | Attending: Obstetrics and Gynecology | Admitting: Registered"

## 2016-12-30 ENCOUNTER — Encounter: Payer: Self-pay | Admitting: Advanced Practice Midwife

## 2016-12-30 ENCOUNTER — Ambulatory Visit (INDEPENDENT_AMBULATORY_CARE_PROVIDER_SITE_OTHER): Payer: Medicaid Other | Admitting: Advanced Practice Midwife

## 2016-12-30 ENCOUNTER — Encounter: Payer: Self-pay | Admitting: Registered"

## 2016-12-30 DIAGNOSIS — O0993 Supervision of high risk pregnancy, unspecified, third trimester: Secondary | ICD-10-CM

## 2016-12-30 DIAGNOSIS — Z3A00 Weeks of gestation of pregnancy not specified: Secondary | ICD-10-CM | POA: Insufficient documentation

## 2016-12-30 DIAGNOSIS — O24419 Gestational diabetes mellitus in pregnancy, unspecified control: Secondary | ICD-10-CM

## 2016-12-30 DIAGNOSIS — Z713 Dietary counseling and surveillance: Secondary | ICD-10-CM | POA: Diagnosis not present

## 2016-12-30 DIAGNOSIS — R7309 Other abnormal glucose: Secondary | ICD-10-CM

## 2016-12-30 MED ORDER — ACCU-CHEK FASTCLIX LANCETS MISC: 1.0000 [IU] | Freq: Four times a day (QID) | 12 refills | Status: DC

## 2016-12-30 NOTE — Progress Notes (Signed)
   PRENATAL VISIT NOTE  Subjective:  Patricia Wyatt is a 21 y.o. G1P0 at 3628w0d being seen today for ongoing prenatal care.  She is currently monitored for the following issues for this high-risk pregnancy and has Supervision of high risk pregnancy, antepartum, third trimester; Obesity in pregnancy; and GDM (gestational diabetes mellitus) on her problem list.  Patient reports occasional contractions.  Contractions: Irregular. Vag. Bleeding: None.  Movement: Present. Denies leaking of fluid.   The following portions of the patient's history were reviewed and updated as appropriate: allergies, current medications, past family history, past medical history, past social history, past surgical history and problem list. Problem list updated.  Had DM class yesterday and hasn't started testing yet. Patricia Wyatt. Went to get Lancets and Rx was expired.   Objective:   Vitals:   12/30/16 1518  BP: 126/81  Pulse: (!) 105  Weight: 210 lb 9.6 oz (95.5 kg)    Fetal Status: Fetal Heart Rate (bpm): 150 Fundal Height: 33 cm Movement: Present     General:  Alert, oriented and cooperative. Patient is in no acute distress.  Skin: Skin is warm and dry. No rash noted.   Cardiovascular: Normal heart rate noted  Respiratory: Normal respiratory effort, no problems with respiration noted  Abdomen: Soft, gravid, appropriate for gestational age.  Pain/Pressure: Absent     Pelvic: Cervical exam deferred        Extremities: Normal range of motion.  Edema: Moderate pitting, indentation subsides rapidly  Mental Status:  Normal mood and affect. Normal behavior. Normal judgment and thought content.   Assessment and Plan:  Pregnancy: G1P0 at 3528w0d  1. Gestational diabetes mellitus (GDM), antepartum, gestational diabetes method of control unspecified  - ACCU-CHEK FASTCLIX LANCETS MISC; 1 Units by Percutaneous route 4 (four) times daily.  Dispense: 100 each; Refill: 12  Preterm labor symptoms and general obstetric precautions  including but not limited to vaginal bleeding, contractions, leaking of fluid and fetal movement were reviewed in detail with the patient. Please refer to After Visit Summary for other counseling recommendations.  Return in about 1 week (around 01/06/2017) for ROB.   Dorathy KinsmanVirginia Garold Sheeler, CNM

## 2016-12-30 NOTE — Patient Instructions (Addendum)
AREA PEDIATRIC/FAMILY PRACTICE PHYSICIANS  [facility] [address], [address] [place], [place]  [address] Phone - [phone]   Fax - [phone]  [facility] [address] [place], [place] [address] Phone - [phone]   Fax - [phone]  JACK AMOS [address] [place], [place]  [address] Phone - [phone]   Fax - [phone]  [facility] [address] [place], [place]  [address] Phone - [phone]   Fax - [phone]  [facility] [address] [place], [place]  [address] Phone - [phone]   Fax - [phone]  [facility] [address] [place], [place]  [address] Phone - [phone]   Fax - [phone]  [facility] [address] [place], [place]  [address] Phone - [phone]   Fax - [phone]  [facility] [address], [address] [place], [place]  [address] Phone - [phone]   Fax - [phone]  [facility] [address] [place], [place]  [address] Phone - [phone]   Fax - [phone] [facility] [address] [place], [place]  [address] Phone - [phone]   Fax - [phone]  [facility] [address] [place], [place]  [address] Phone - [phone]   Fax - [phone]  [facility] [address] [place], [place]  [address] Phone - [phone]   Fax - [phone]  [facility] [address] [place], [place]  [address] Phone - [phone]   Fax - [phone]  SHILPA GOSRANI [address] [place], [place]  [address] Phone - [phone]  [facility] [address] [place], [place]  [address] Phone - [phone]   Fax - [phone]  [facility] [address] [place], [place]  [address] Phone - [phone]   Fax - [phone]  [facility] [address] [place], [place]  [address] Phone - [phone]   Fax - [phone]  [facility] [address] [place], [place]  [address] Phone - [phone]   Fax - [phone]   [facility] [address] [place], [place]  [address] Phone - [phone]   Fax - [phone]  [facility] [address] [place], [place]  [address] Phone - [phone]   Fax - [phone]  DAVID RUBIN [address] [place], [place]  [address] Phone - [phone]   Fax - [phone]  [facility] [address] , [place]  [address] Phone - [phone]   Fax - [phone]  [facility] [address] , [place]  [address] Phone - [phone]   Fax - [phone] [facility] [address] [place], [place]  [address] Phone - [phone]   Fax - [phone]  [facility] [address] [place], [place]  [address] Phone - [phone]   Fax - [phone]   [facility] [address] [place], [place]  [address] Phone - [phone]   Fax - [phone]  [facility] Charlene Flemming MD [address]  [place] [address] Phone [phone]  Fax [phone]   Third Trimester of Pregnancy The third trimester is from week 28 through week 40 (  months 7 through 9). The third trimester is a time when the unborn baby (fetus) is growing rapidly. At the end of the ninth month, the fetus is about 20 inches in length and weighs 6-10 pounds. Body changes during your third trimester Your body will continue to go through many changes during pregnancy. The changes vary from woman to woman. During the third trimester:  Your weight will continue to increase.  You can expect to gain 25-35 pounds (11-16 kg) by the end of the pregnancy.  You may begin to get stretch marks on your hips, abdomen, and breasts.  You may urinate more often because the fetus is moving lower into your pelvis and pressing on your bladder.  You may develop or continue to have heartburn. This is caused by increased hormones that slow down muscles in the digestive tract.  You may develop or continue to have constipation because increased hormones slow digestion and cause the muscles that push waste through your intestines to relax.  You may develop hemorrhoids. These are swollen veins (varicose veins) in the rectum that can itch or be painful.  You may develop swollen, bulging veins (varicose veins) in your legs.  You may have increased body aches in the pelvis, back, or thighs. This is due to weight gain and increased hormones that are relaxing your joints.  You may have changes in your hair. These can include thickening of your hair, rapid growth, and changes in texture. Some women also have hair loss during or after pregnancy, or hair that feels dry or thin. Your hair will most likely return to normal after your baby is born.  Your breasts will continue to grow and they will continue to become tender. A yellow fluid (colostrum) may leak from your breasts. This is the first milk you are producing for your baby.  Your belly button may stick out.  You may notice more swelling in your hands, face, or ankles.  You may have increased tingling or numbness in your hands, arms, and legs. The skin on your belly may also feel numb.  You may feel short of breath because of your expanding uterus.  You may have more problems sleeping. This can be caused by the size of your belly, increased need to urinate, and an increase in your body's metabolism.  You may notice the fetus "dropping," or moving lower in your abdomen (lightening).  You may have increased vaginal discharge.  You  may notice your joints feel loose and you may have pain around your pelvic bone.  What to expect at prenatal visits You will have prenatal exams every 2 weeks until week 36. Then you will have weekly prenatal exams. During a routine prenatal visit:  You will be weighed to make sure you and the baby are growing normally.  Your blood pressure will be taken.  Your abdomen will be measured to track your baby's growth.  The fetal heartbeat will be listened to.  Any test results from the previous visit will be discussed.  You may have a cervical check near your due date to see if your cervix has softened or thinned (effaced).  You will be tested for Group B streptococcus. This happens between 35 and 37 weeks.  Your health care provider may ask you:  What your birth plan is.  How you are feeling.  If you are feeling the baby move.  If you have had any abnormal symptoms, such as leaking fluid, bleeding, severe headaches, or abdominal cramping.    If you are using any tobacco products, including cigarettes, chewing tobacco, and electronic cigarettes.  If you have any questions.  Other tests or screenings that may be performed during your third trimester include:  Blood tests that check for low iron levels (anemia).  Fetal testing to check the health, activity level, and growth of the fetus. Testing is done if you have certain medical conditions or if there are problems during the pregnancy.  Nonstress test (NST). This test checks the health of your baby to make sure there are no signs of problems, such as the baby not getting enough oxygen. During this test, a belt is placed around your belly. The baby is made to move, and its heart rate is monitored during movement.  What is false labor? False labor is a condition in which you feel small, irregular tightenings of the muscles in the womb (contractions) that usually go away with rest, changing position, or drinking water. These are  called Braxton Hicks contractions. Contractions may last for hours, days, or even weeks before true labor sets in. If contractions come at regular intervals, become more frequent, increase in intensity, or become painful, you should see your health care provider. What are the signs of labor?  Abdominal cramps.  Regular contractions that start at 10 minutes apart and become stronger and more frequent with time.  Contractions that start on the top of the uterus and spread down to the lower abdomen and back.  Increased pelvic pressure and dull back pain.  A watery or bloody mucus discharge that comes from the vagina.  Leaking of amniotic fluid. This is also known as your "water breaking." It could be a slow trickle or a gush. Let your health care provider know if it has a color or strange odor. If you have any of these signs, call your health care provider right away, even if it is before your due date. Follow these instructions at home: Medicines  Follow your health care provider's instructions regarding medicine use. Specific medicines may be either safe or unsafe to take during pregnancy.  Take a prenatal vitamin that contains at least 600 micrograms (mcg) of folic acid.  If you develop constipation, try taking a stool softener if your health care provider approves. Eating and drinking  Eat a balanced diet that includes fresh fruits and vegetables, whole grains, good sources of protein such as meat, eggs, or tofu, and low-fat dairy. Your health care provider will help you determine the amount of weight gain that is right for you.  Avoid raw meat and uncooked cheese. These carry germs that can cause birth defects in the baby.  If you have low calcium intake from food, talk to your health care provider about whether you should take a daily calcium supplement.  Eat four or five small meals rather than three large meals a day.  Limit foods that are high in fat and processed sugars, such  as fried and sweet foods.  To prevent constipation: ? Drink enough fluid to keep your urine clear or pale yellow. ? Eat foods that are high in fiber, such as fresh fruits and vegetables, whole grains, and beans. Activity  Exercise only as directed by your health care provider. Most women can continue their usual exercise routine during pregnancy. Try to exercise for 30 minutes at least 5 days a week. Stop exercising if you experience uterine contractions.  Avoid heavy lifting.  Do not exercise in extreme heat or humidity, or at high altitudes.    Wear low-heel, comfortable shoes.  Practice good posture.  You may continue to have sex unless your health care provider tells you otherwise. Relieving pain and discomfort  Take frequent breaks and rest with your legs elevated if you have leg cramps or low back pain.  Take warm sitz baths to soothe any pain or discomfort caused by hemorrhoids. Use hemorrhoid cream if your health care provider approves.  Wear a good support bra to prevent discomfort from breast tenderness.  If you develop varicose veins: ? Wear support pantyhose or compression stockings as told by your healthcare provider. ? Elevate your feet for 15 minutes, 3-4 times a day. Prenatal care  Write down your questions. Take them to your prenatal visits.  Keep all your prenatal visits as told by your health care provider. This is important. Safety  Wear your seat belt at all times when driving.  Make a list of emergency phone numbers, including numbers for family, friends, the hospital, and police and fire departments. General instructions  Avoid cat litter boxes and soil used by cats. These carry germs that can cause birth defects in the baby. If you have a cat, ask someone to clean the litter box for you.  Do not travel far distances unless it is absolutely necessary and only with the approval of your health care provider.  Do not use hot tubs, steam rooms, or  saunas.  Do not drink alcohol.  Do not use any products that contain nicotine or tobacco, such as cigarettes and e-cigarettes. If you need help quitting, ask your health care provider.  Do not use any medicinal herbs or unprescribed drugs. These chemicals affect the formation and growth of the baby.  Do not douche or use tampons or scented sanitary pads.  Do not cross your legs for long periods of time.  To prepare for the arrival of your baby: ? Take prenatal classes to understand, practice, and ask questions about labor and delivery. ? Make a trial run to the hospital. ? Visit the hospital and tour the maternity area. ? Arrange for maternity or paternity leave through employers. ? Arrange for family and friends to take care of pets while you are in the hospital. ? Purchase a rear-facing car seat and make sure you know how to install it in your car. ? Pack your hospital bag. ? Prepare the baby's nursery. Make sure to remove all pillows and stuffed animals from the baby's crib to prevent suffocation.  Visit your dentist if you have not gone during your pregnancy. Use a soft toothbrush to brush your teeth and be gentle when you floss. Contact a health care provider if:  You are unsure if you are in labor or if your water has broken.  You become dizzy.  You have mild pelvic cramps, pelvic pressure, or nagging pain in your abdominal area.  You have lower back pain.  You have persistent nausea, vomiting, or diarrhea.  You have an unusual or bad smelling vaginal discharge.  You have pain when you urinate. Get help right away if:  Your water breaks before 37 weeks.  You have regular contractions less than 5 minutes apart before 37 weeks.  You have a fever.  You are leaking fluid from your vagina.  You have spotting or bleeding from your vagina.  You have severe abdominal pain or cramping.  You have rapid weight loss or weight gain.  You have shortness of breath with  chest pain.  You notice sudden or extreme   swelling of your face, hands, ankles, feet, or legs.  Your baby makes fewer than 10 movements in 2 hours.  You have severe headaches that do not go away when you take medicine.  You have vision changes. Summary  The third trimester is from week 28 through week 40, months 7 through 9. The third trimester is a time when the unborn baby (fetus) is growing rapidly.  During the third trimester, your discomfort may increase as you and your baby continue to gain weight. You may have abdominal, leg, and back pain, sleeping problems, and an increased need to urinate.  During the third trimester your breasts will keep growing and they will continue to become tender. A yellow fluid (colostrum) may leak from your breasts. This is the first milk you are producing for your baby.  False labor is a condition in which you feel small, irregular tightenings of the muscles in the womb (contractions) that eventually go away. These are called Braxton Hicks contractions. Contractions may last for hours, days, or even weeks before true labor sets in.  Signs of labor can include: abdominal cramps; regular contractions that start at 10 minutes apart and become stronger and more frequent with time; watery or bloody mucus discharge that comes from the vagina; increased pelvic pressure and dull back pain; and leaking of amniotic fluid. This information is not intended to replace advice given to you by your health care provider. Make sure you discuss any questions you have with your health care provider. Document Released: 05/26/2001 Document Revised: 11/07/2015 Document Reviewed: 08/02/2012 Elsevier Interactive Patient Education  2017 [employer] Inc[employer] Contraception Choices Contraception (birth control) is the use of any methods or devices to prevent pregnancy. Below are some methods to help avoid pregnancy. Hormonal methods  Contraceptive implant. This is a thin, plastic tube  containing progesterone hormone. It does not contain estrogen hormone. Your health care provider inserts the tube in the inner part of the upper arm. The tube can remain in place for up to 3 years. After 3 years, the implant must be removed. The implant prevents the ovaries from releasing an egg (ovulation), thickens the cervical mucus to prevent sperm from entering the uterus, and thins the lining of the inside of the uterus.  Progesterone-only injections. These injections are given every 3 months by your health care provider to prevent pregnancy. This synthetic progesterone hormone stops the ovaries from releasing eggs. It also thickens cervical mucus and changes the uterine lining. This makes it harder for sperm to survive in the uterus.  Birth control pills. These pills contain estrogen and progesterone hormone. They work by preventing the ovaries from releasing eggs (ovulation). They also cause the cervical mucus to thicken, preventing the sperm from entering the uterus. Birth control pills are prescribed by a health care provider.Birth control pills can also be used to treat heavy periods.  Minipill. This type of birth control pill contains only the progesterone hormone. They are taken every day of each month and must be prescribed by your health care provider.  Birth control patch. The patch contains hormones similar to those in birth control pills. It must be changed once a week and is prescribed by a health care provider.  Vaginal ring. The ring contains hormones similar to those in birth control pills. It is left in the vagina for 3 weeks, removed for 1 week, and then a new one is put back in place. The patient must be comfortable inserting and removing the ring from the   vagina.A health care provider's prescription is necessary.  Emergency contraception. Emergency contraceptives prevent pregnancy after unprotected sexual intercourse. This pill can be taken right after sex or up to 5 days  after unprotected sex. It is most effective the sooner you take the pills after having sexual intercourse. Most emergency contraceptive pills are available without a prescription. Check with your pharmacist. Do not use emergency contraception as your only form of birth control. Barrier methods  Female condom. This is a thin sheath (latex or rubber) that is worn over the penis during sexual intercourse. It can be used with spermicide to increase effectiveness.  Female condom. This is a soft, loose-fitting sheath that is put into the vagina before sexual intercourse.  Diaphragm. This is a soft, latex, dome-shaped barrier that must be fitted by a health care provider. It is inserted into the vagina, along with a spermicidal jelly. It is inserted before intercourse. The diaphragm should be left in the vagina for 6 to 8 hours after intercourse.  Cervical cap. This is a round, soft, latex or plastic cup that fits over the cervix and must be fitted by a health care provider. The cap can be left in place for up to 48 hours after intercourse.  Sponge. This is a soft, circular piece of polyurethane foam. The sponge has spermicide in it. It is inserted into the vagina after wetting it and before sexual intercourse.  Spermicides. These are chemicals that kill or block sperm from entering the cervix and uterus. They come in the form of creams, jellies, suppositories, foam, or tablets. They do not require a prescription. They are inserted into the vagina with an applicator before having sexual intercourse. The process must be repeated every time you have sexual intercourse. Intrauterine contraception  Intrauterine device (IUD). This is a T-shaped device that is put in a woman's uterus during a menstrual period to prevent pregnancy. There are 2 types: ? Copper IUD. This type of IUD is wrapped in copper wire and is placed inside the uterus. Copper makes the uterus and fallopian tubes produce a fluid that kills sperm.  It can stay in place for 10 years. ? Hormone IUD. This type of IUD contains the hormone progestin (synthetic progesterone). The hormone thickens the cervical mucus and prevents sperm from entering the uterus, and it also thins the uterine lining to prevent implantation of a fertilized egg. The hormone can weaken or kill the sperm that get into the uterus. It can stay in place for 3-5 years, depending on which type of IUD is used. Permanent methods of contraception  Female tubal ligation. This is when the woman's fallopian tubes are surgically sealed, tied, or blocked to prevent the egg from traveling to the uterus.  Hysteroscopic sterilization. This involves placing a small coil or insert into each fallopian tube. Your doctor uses a technique called hysteroscopy to do the procedure. The device causes scar tissue to form. This results in permanent blockage of the fallopian tubes, so the sperm cannot fertilize the egg. It takes about 3 months after the procedure for the tubes to become blocked. You must use another form of birth control for these 3 months.  Female sterilization. This is when the female has the tubes that carry sperm tied off (vasectomy).This blocks sperm from entering the vagina during sexual intercourse. After the procedure, the man can still ejaculate fluid (semen). Natural planning methods  Natural family planning. This is not having sexual intercourse or using a barrier method (condom, diaphragm, cervical   cap) on days the woman could become pregnant.  Calendar method. This is keeping track of the length of each menstrual cycle and identifying when you are fertile.  Ovulation method. This is avoiding sexual intercourse during ovulation.  Symptothermal method. This is avoiding sexual intercourse during ovulation, using a thermometer and ovulation symptoms.  Post-ovulation method. This is timing sexual intercourse after you have ovulated. Regardless of which type or method of  contraception you choose, it is important that you use condoms to protect against the transmission of sexually transmitted infections (STIs). Talk with your health care provider about which form of contraception is most appropriate for you. This information is not intended to replace advice given to you by your health care provider. Make sure you discuss any questions you have with your health care provider. Document Released: 06/01/2005 Document Revised: 11/07/2015 Document Reviewed: 11/24/2012 Elsevier Interactive Patient Education  2017 Elsevier Inc.  

## 2016-12-30 NOTE — Progress Notes (Signed)
Patient was seen on 12/30/2016 for Gestational Diabetes self-management class at [facility]. The following learning objectives were met by the patient during this course:   States the definition of Gestational Diabetes  States why dietary management is important in controlling blood glucose  Describes the effects each nutrient has on blood glucose levels  Demonstrates ability to create a balanced meal plan  Demonstrates carbohydrate counting   States when to check blood glucose levels  Demonstrates proper blood glucose monitoring techniques  States the effect of stress and exercise on blood glucose levels  States the importance of limiting caffeine and abstaining from alcohol and smoking  Blood glucose monitor given: none Lot # n/a Exp: n/a Blood glucose reading: pt did not bring meter to test BG, but was she given instruction on how to use the type of meter that she owns.  Patient instructed to monitor glucose levels: FBS: 60 - <95 1 hour: <140 2 hour: <120  Patient received handouts:  Nutrition Diabetes and Pregnancy  Carbohydrate Counting List  Patient will be seen for follow-up as needed.

## 2016-12-31 ENCOUNTER — Telehealth: Payer: Self-pay | Admitting: *Deleted

## 2016-12-31 DIAGNOSIS — O24410 Gestational diabetes mellitus in pregnancy, diet controlled: Secondary | ICD-10-CM

## 2016-12-31 MED ORDER — ACCU-CHEK SOFTCLIX LANCETS MISC: 12 refills | Status: DC

## 2016-12-31 NOTE — Telephone Encounter (Signed)
Pt called needing a new rx for the Accu-check soft clix lancets to be sent to her [facility] pharmacy.  Rx sent.

## 2017-01-06 ENCOUNTER — Ambulatory Visit (INDEPENDENT_AMBULATORY_CARE_PROVIDER_SITE_OTHER): Payer: Medicaid Other | Admitting: Family Medicine

## 2017-01-06 VITALS — BP 123/83 | HR 82 | Wt 213.0 lb

## 2017-01-06 DIAGNOSIS — O24410 Gestational diabetes mellitus in pregnancy, diet controlled: Secondary | ICD-10-CM

## 2017-01-06 DIAGNOSIS — O0993 Supervision of high risk pregnancy, unspecified, third trimester: Secondary | ICD-10-CM

## 2017-01-06 DIAGNOSIS — O99210 Obesity complicating pregnancy, unspecified trimester: Secondary | ICD-10-CM

## 2017-01-06 NOTE — Progress Notes (Signed)
   PRENATAL VISIT NOTE  Subjective:  Patricia Wyatt is a 21 y.o. G1P0 at 1726w0d being seen today for ongoing prenatal care.  She is currently monitored for the following issues for this high-risk pregnancy and has Supervision of high risk pregnancy, antepartum, third trimester; Obesity in pregnancy; and GDM (gestational diabetes mellitus) on her problem list.  Patient reports no complaints.  Contractions: Irregular. Vag. Bleeding: None.  Movement: Present. Denies leaking of fluid.   The following portions of the patient's history were reviewed and updated as appropriate: allergies, current medications, past family history, past medical history, past social history, past surgical history and problem list. Problem list updated.  Objective:   Vitals:   01/06/17 1705  BP: 123/83  Pulse: 82  Weight: 213 lb (96.6 kg)    Fetal Status: Fetal Heart Rate (bpm): 134   Movement: Present     General:  Alert, oriented and cooperative. Patient is in no acute distress.  Skin: Skin is warm and dry. No rash noted.   Cardiovascular: Normal heart rate noted  Respiratory: Normal respiratory effort, no problems with respiration noted  Abdomen: Soft, gravid, appropriate for gestational age.  Pain/Pressure: Absent     Pelvic: Cervical exam deferred        Extremities: Normal range of motion.  Edema: Trace  Mental Status:  Normal mood and affect. Normal behavior. Normal judgment and thought content.   Assessment and Plan:  Pregnancy: G1P0 at 4426w0d  1. Diet controlled gestational diabetes mellitus (GDM) in third trimester Fasting- 89-99 ( only 2 of 7 above goal) 2hr pp 91-130 ( only 5 of 35 above goal) Well controlled, applauded for efforts and encouraged increasing exercise.   2. Supervision of high risk pregnancy, antepartum, third trimester UTD   3. Obesity in pregnancy 3 lb (1.361 kg) , appropriate weight gain.   Preterm labor symptoms and general obstetric precautions including but not limited to  vaginal bleeding, contractions, leaking of fluid and fetal movement were reviewed in detail with the patient. Please refer to After Visit Summary for other counseling recommendations.  Return in about 2 weeks (around 01/20/2017) for Routine prenatal care.   Federico FlakeKimberly Niles Amine Adelson, MD

## 2017-01-09 ENCOUNTER — Encounter (HOSPITAL_COMMUNITY): Payer: Self-pay | Admitting: *Deleted

## 2017-01-09 ENCOUNTER — Inpatient Hospital Stay (HOSPITAL_COMMUNITY)
Admission: AD | Admit: 2017-01-09 | Discharge: 2017-01-09 | Disposition: A | Discharge status: Home / Self Care | Payer: Medicaid Other | Source: Ambulatory Visit | Attending: Obstetrics and Gynecology | Admitting: Obstetrics and Gynecology

## 2017-01-09 DIAGNOSIS — Z888 Allergy status to other drugs, medicaments and biological substances status: Secondary | ICD-10-CM | POA: Diagnosis not present

## 2017-01-09 DIAGNOSIS — O99613 Diseases of the digestive system complicating pregnancy, third trimester: Secondary | ICD-10-CM | POA: Insufficient documentation

## 2017-01-09 DIAGNOSIS — K5900 Constipation, unspecified: Secondary | ICD-10-CM | POA: Diagnosis not present

## 2017-01-09 DIAGNOSIS — O26893 Other specified pregnancy related conditions, third trimester: Secondary | ICD-10-CM | POA: Insufficient documentation

## 2017-01-09 DIAGNOSIS — O9989 Other specified diseases and conditions complicating pregnancy, childbirth and the puerperium: Secondary | ICD-10-CM | POA: Diagnosis not present

## 2017-01-09 DIAGNOSIS — Z801 Family history of malignant neoplasm of trachea, bronchus and lung: Secondary | ICD-10-CM | POA: Diagnosis not present

## 2017-01-09 DIAGNOSIS — O26899 Other specified pregnancy related conditions, unspecified trimester: Secondary | ICD-10-CM

## 2017-01-09 DIAGNOSIS — Z3A34 34 weeks gestation of pregnancy: Secondary | ICD-10-CM | POA: Diagnosis not present

## 2017-01-09 DIAGNOSIS — R109 Unspecified abdominal pain: Secondary | ICD-10-CM | POA: Diagnosis not present

## 2017-01-09 DIAGNOSIS — Z8632 Personal history of gestational diabetes: Secondary | ICD-10-CM | POA: Diagnosis not present

## 2017-01-09 DIAGNOSIS — Z87891 Personal history of nicotine dependence: Secondary | ICD-10-CM | POA: Diagnosis not present

## 2017-01-09 DIAGNOSIS — Z79899 Other long term (current) drug therapy: Secondary | ICD-10-CM | POA: Diagnosis not present

## 2017-01-09 HISTORY — DX: Gestational diabetes mellitus in pregnancy, unspecified control: O24.419

## 2017-01-09 LAB — WET PREP, GENITAL
Clue Cells Wet Prep HPF POC: NONE SEEN
SPERM: NONE SEEN
TRICH WET PREP: NONE SEEN
YEAST WET PREP: NONE SEEN

## 2017-01-09 LAB — URINALYSIS, ROUTINE W REFLEX MICROSCOPIC
Bilirubin Urine: NEGATIVE
Glucose, UA: NEGATIVE mg/dL
HGB URINE DIPSTICK: NEGATIVE
Ketones, ur: NEGATIVE mg/dL
Nitrite: NEGATIVE
PROTEIN: 30 mg/dL — AB
SPECIFIC GRAVITY, URINE: 1.027 (ref 1.005–1.030)
pH: 6 (ref 5.0–8.0)

## 2017-01-09 MED ORDER — DOCUSATE SODIUM 100 MG PO CAPS: 100.0000 mg | ORAL_CAPSULE | Freq: Two times a day (BID) | ORAL | 0 refills | Status: AC

## 2017-01-09 NOTE — MAU Note (Signed)
Pt report she has been having lower  abd pain/cramping off and on since this morning. Denies and Leaking reports a small amount of spotting earlier today but nothing now. Good fetal movement felt.

## 2017-01-09 NOTE — MAU Provider Note (Signed)
History     CSN: [id number]  Arrival date and time: 01/09/17 1512  First Provider Initiated Contact with Patient 01/09/17 1544      Chief Complaint  Patient presents with  . Contractions  . Abdominal Pain   HPI Patricia Wyatt is a 21 y.o. G1P0 at 66w3dwho presents with abdominal pain & constipation. Abdominal pain started 6 hours ago. Feels like cramping that occurs less than 5 times per hour. Rates pain 6/10. Has not treated. Thinks pain is due to constipation. She has had issues with constipation intermittently during the pregnancy. Last BM was on Thursday & states she feels like she has to have a BM but can't go. Has not been treating constipation. Called triage nurse to find out what she could take for constipation & was told to come to MAU for evaluation. Denies n/v, diarrhea, dysuria, vaginal discharge. Positive fetal movement. No recent intercourse. Had 1 episode of pink spotting on toilet paper this morning; no bleeding since.   OB History    Gravida Para Term Preterm AB Living   1             SAB TAB Ectopic Multiple Live Births                  Past Medical History:  Diagnosis Date  . Gestational diabetes     Past Surgical History:  Procedure Laterality Date  . NO PAST SURGERIES      Family History  Problem Relation Age of Onset  . Lung cancer Maternal Grandfather     Social History  Substance Use Topics  . Smoking status: Former Smoker    Types: Cigarettes    Quit date: 07/16/2016  . Smokeless tobacco: Never Used     Comment: Stopped smoking when found out about pregnancy  . Alcohol use Yes     Comment: Pt stopeed when she found out she was pregnanct    Allergies:  Allergies  Allergen Reactions  . Chamomile Swelling and Rash    Prescriptions Prior to Admission  Medication Sig Dispense Refill Last Dose  . ACCU-CHEK FASTCLIX LANCETS MISC 1 Units by Percutaneous route 4 (four) times daily. 100 each 12 Taking  . ACCU-CHEK SOFTCLIX LANCETS lancets Use as  instructed, to check blood sugars 4 times daily. 120 each 12 Taking  . acetaminophen (TYLENOL) 325 MG tablet Take 650 mg by mouth every 6 (six) hours as needed.   Taking  . Blood Glucose Monitoring Suppl (ACCU-CHEK AVIVA PLUS) w/Device KIT 1 Device by Does not apply route 4 (four) times daily. 1 kit 0 Taking  . Elastic Bandages & Supports (COMFORT FIT MATERNITY SUPP SM) MISC 1 Units by Does not apply route daily as needed. 1 each 0 Taking  . glucose blood (ACCU-CHEK AVIVA PLUS) test strip Use as instructed 100 each 12 Taking  . glucose blood test strip Use as instructed 100 each 12 Taking  . glucose blood test strip Use as instructed 100 each 12 Taking  . Prenatal Vit-Fe Fumarate-FA (MULTIVITAMIN-PRENATAL) 27-0.8 MG TABS tablet Take 1 tablet by mouth daily at 12 noon.   Taking  . promethazine (PHENERGAN) 12.5 MG tablet Take 1 tablet (12.5 mg total) by mouth every 6 (six) hours as needed for nausea or vomiting. 30 tablet 0 Taking    Review of Systems  Constitutional: Negative.   Gastrointestinal: Positive for abdominal pain and constipation. Negative for anal bleeding, blood in stool, diarrhea, nausea and vomiting.  Genitourinary: Negative for dysuria, vaginal  bleeding (x1 episode pink spotting this morning) and vaginal discharge.   Physical Exam   Blood pressure 129/80, pulse 99, temperature 97.7 F (36.5 C), temperature source Oral, resp. rate 18, height _0  (1.727 m), weight 211 lb (95.7 kg), last menstrual period 05/13/2016.  Physical Exam  Nursing note and vitals reviewed. Constitutional: She is oriented to person, place, and time. She appears well-developed and well-nourished. No distress.  HENT:  Head: Normocephalic and atraumatic.  Eyes: Conjunctivae are normal. Right eye exhibits no discharge. Left eye exhibits no discharge. No scleral icterus.  Neck: Normal range of motion.  Respiratory: Effort normal. No respiratory distress.  GI: Soft. There is no tenderness.   Genitourinary: No bleeding in the vagina. Vaginal discharge (small amount of white curdy discharge adherant to vaginal walls & cervix) found.  Neurological: She is alert and oriented to person, place, and time.  Skin: Skin is warm and dry. She is not diaphoretic.  Psychiatric: She has a normal mood and affect. Her behavior is normal. Judgment and thought content normal.   Effacement (%): Thick Cervical Position: Posterior Exam by:: E.Perris Conwell, NP  Fetal Tracing:  Baseline: 135 Variability: moderate Accelerations: 15x15 Decelerations: none  Toco: irr ctx MAU Course  Procedures Results for orders placed or performed during the hospital encounter of 01/09/17 (from the past 24 hour(s))  Urinalysis, Routine w reflex microscopic     Status: Abnormal   Collection Time: 01/09/17  3:18 PM  Result Value Ref Range   Color, Urine AMBER (A) YELLOW   APPearance CLOUDY (A) CLEAR   Specific Gravity, Urine 1.027 1.005 - 1.030   pH 6.0 5.0 - 8.0   Glucose, UA NEGATIVE NEGATIVE mg/dL   Hgb urine dipstick NEGATIVE NEGATIVE   Bilirubin Urine NEGATIVE NEGATIVE   Ketones, ur NEGATIVE NEGATIVE mg/dL   Protein, ur 30 (A) NEGATIVE mg/dL   Nitrite NEGATIVE NEGATIVE   Leukocytes, UA TRACE (A) NEGATIVE   RBC / HPF 0-5 0 - 5 RBC/hpf   WBC, UA 6-30 0 - 5 WBC/hpf   Bacteria, UA MANY (A) NONE SEEN   Squamous Epithelial / LPF 6-30 (A) NONE SEEN   Mucous PRESENT   Wet prep, genital     Status: Abnormal   Collection Time: 01/09/17  4:00 PM  Result Value Ref Range   Yeast Wet Prep HPF POC NONE SEEN NONE SEEN   Trich, Wet Prep NONE SEEN NONE SEEN   Clue Cells Wet Prep HPF POC NONE SEEN NONE SEEN   WBC, Wet Prep HPF POC FEW (A) NONE SEEN   Sperm NONE SEEN     MDM Reactive NST with irregular contractions. Cervix closed/thick/posterior. No blood noted on exam. Wet prep ordered.  Assessment and Plan  A; 1. Constipation during pregnancy in third trimester   2. Abdominal pain affecting pregnancy     P: Discharge home Rx colace Discussed tx of constipation at home Discussed reasons to return to MAU Keep f/u with OB  [name] 01/09/2017, 3:44 PM

## 2017-01-09 NOTE — Discharge Instructions (Signed)
Constipation, Adult Constipation is when a person has fewer bowel movements in a week than normal, has difficulty having a bowel movement, or has stools that are dry, hard, or larger than normal. Constipation may be caused by an underlying condition. It may become worse with age if a person takes certain medicines and does not take in enough fluids. Follow these instructions at home: Eating and drinking   Eat foods that have a lot of fiber, such as fresh fruits and vegetables, whole grains, and beans.  Limit foods that are high in fat, low in fiber, or overly processed, such as french fries, hamburgers, cookies, candies, and soda.  Drink enough fluid to keep your urine clear or pale yellow. General instructions  Exercise regularly or as told by your health care provider.  Go to the restroom when you have the urge to go. Do not hold it in.  Take over-the-counter and prescription medicines only as told by your health care provider. These include any fiber supplements.  Practice pelvic floor retraining exercises, such as deep breathing while relaxing the lower abdomen and pelvic floor relaxation during bowel movements.  Watch your condition for any changes.  Keep all follow-up visits as told by your health care provider. This is important. Contact a health care provider if:  You have pain that gets worse.  You have a fever.  You do not have a bowel movement after 4 days.  You vomit.  You are not hungry.  You lose weight.  You are bleeding from the anus.  You have thin, pencil-like stools. Get help right away if:  You have a fever and your symptoms suddenly get worse.  You leak stool or have blood in your stool.  Your abdomen is bloated.  You have severe pain in your abdomen.  You feel dizzy or you faint. This information is not intended to replace advice given to you by your health care provider. Make sure you discuss any questions you have with your health care  provider. Document Released: 02/28/2004 Document Revised: 12/20/2015 Document Reviewed: 11/20/2015 Elsevier Interactive Patient Education  2017 [employer]    Safe Medications in Pregnancy   Acne: Benzoyl Peroxide Salicylic Acid  Backache/Headache: Tylenol: 2 regular strength every 4 hours OR              2 Extra strength every 6 hours  Colds/Coughs/Allergies: Benadryl (alcohol free) 25 mg every 6 hours as needed Breath right strips Claritin Cepacol throat lozenges Chloraseptic throat spray Cold-Eeze- up to three times per day Cough drops, alcohol free Flonase (by prescription only) Guaifenesin Mucinex Robitussin DM (plain only, alcohol free) Saline nasal spray/drops Sudafed (pseudoephedrine) & Actifed ** use only after [redacted] weeks gestation and if you do not have high blood pressure Tylenol Vicks Vaporub Zinc lozenges Zyrtec   Constipation: Colace Ducolax suppositories Fleet enema Glycerin suppositories Metamucil Milk of magnesia Miralax Senokot Smooth move tea  Diarrhea: Kaopectate Imodium A-D  *NO pepto Bismol  Hemorrhoids: Anusol Anusol HC Preparation H Tucks  Indigestion: Tums Maalox Mylanta Zantac  Pepcid  Insomnia: Benadryl (alcohol free) 25mg  every 6 hours as needed Tylenol PM Unisom, no Gelcaps  Leg Cramps: Tums MagGel  Nausea/Vomiting:  Bonine Dramamine Emetrol Ginger extract Sea bands Meclizine  Nausea medication to take during pregnancy:  Unisom (doxylamine succinate 25 mg tablets) Take one tablet daily at bedtime. If symptoms are not adequately controlled, the dose can be increased to a maximum recommended dose of two tablets daily (1/2 tablet in the  morning, 1/2 tablet mid-afternoon and one at bedtime). Vitamin B6 100mg  tablets. Take one tablet twice a day (up to 200 mg per day).  Skin Rashes: Aveeno products Benadryl cream or 25mg  every 6 hours as needed Calamine Lotion 1% cortisone cream  Yeast  infection: Gyne-lotrimin 7 Monistat 7  Gum/tooth pain: Anbesol  **If taking multiple medications, please check labels to avoid duplicating the same active ingredients **take medication as directed on the label ** Do not exceed 4000 mg of tylenol in 24 hours **Do not take medications that contain aspirin or ibuprofen

## 2017-01-10 LAB — CULTURE, OB URINE

## 2017-01-20 ENCOUNTER — Other Ambulatory Visit (HOSPITAL_COMMUNITY)
Admission: RE | Admit: 2017-01-20 | Discharge: 2017-01-20 | Disposition: A | Discharge status: Home / Self Care | Payer: Medicaid Other | Source: Ambulatory Visit | Attending: Family Medicine | Admitting: Family Medicine

## 2017-01-20 ENCOUNTER — Ambulatory Visit (INDEPENDENT_AMBULATORY_CARE_PROVIDER_SITE_OTHER): Payer: Medicaid Other | Admitting: Family Medicine

## 2017-01-20 VITALS — BP 120/77 | Wt 216.0 lb

## 2017-01-20 DIAGNOSIS — Z3A00 Weeks of gestation of pregnancy not specified: Secondary | ICD-10-CM | POA: Diagnosis not present

## 2017-01-20 DIAGNOSIS — O24410 Gestational diabetes mellitus in pregnancy, diet controlled: Secondary | ICD-10-CM

## 2017-01-20 DIAGNOSIS — O0993 Supervision of high risk pregnancy, unspecified, third trimester: Secondary | ICD-10-CM

## 2017-01-20 LAB — OB RESULTS CONSOLE GC/CHLAMYDIA: GC PROBE AMP, GENITAL: NEGATIVE

## 2017-01-21 ENCOUNTER — Telehealth: Payer: Self-pay | Admitting: Radiology

## 2017-01-21 LAB — OB RESULTS CONSOLE GBS: STREP GROUP B AG: NEGATIVE

## 2017-01-21 NOTE — Telephone Encounter (Signed)
Called patient and left voicemail on cell phone to call [facility], need to schedule Growth US and need to know her schedule and availability.

## 2017-01-23 LAB — STREP GP B NAA: STREP GROUP B AG: NEGATIVE

## 2017-01-25 LAB — CERVICOVAGINAL ANCILLARY ONLY
Chlamydia: NEGATIVE
Neisseria Gonorrhea: NEGATIVE

## 2017-01-27 ENCOUNTER — Encounter: Payer: Medicaid Other | Admitting: Student

## 2017-01-27 NOTE — Progress Notes (Signed)
   PRENATAL VISIT NOTE  Subjective:  Patricia Wyatt is a 21 y.o. G1P0 at 36w0 being seen today for ongoing prenatal care.  She is currently monitored for the following issues for this high-risk pregnancy and has Supervision of high risk pregnancy, antepartum, third trimester; Obesity in pregnancy; and GDM (gestational diabetes mellitus) on her problem list.  Patient reports no complaints.  Contractions: Irregular. Vag. Bleeding: None.  Movement: Present. Denies leaking of fluid.   The following portions of the patient's history were reviewed and updated as appropriate: allergies, current medications, past family history, past medical history, past social history, past surgical history and problem list. Problem list updated.  Objective:   Vitals:   01/20/17 1743  BP: 120/77  Weight: 216 lb (98 kg)    Fetal Status: Fetal Heart Rate (bpm): 144 Fundal Height: 37 cm Movement: Present     General:  Alert, oriented and cooperative. Patient is in no acute distress.  Skin: Skin is warm and dry. No rash noted.   Cardiovascular: Normal heart rate noted  Respiratory: Normal respiratory effort, no problems with respiration noted  Abdomen: Soft, gravid, appropriate for gestational age.  Pain/Pressure: Absent     Pelvic: Cervical exam deferred        Extremities: Normal range of motion.  Edema: Trace  Mental Status:  Normal mood and affect. Normal behavior. Normal judgment and thought content.   Assessment and Plan:  Pregnancy: G1P0 at 6348w0d  1. Supervision of high risk pregnancy, antepartum, third trimester - Strep Gp B NAA - Cervicovaginal ancillary only - US MFM OB FOLLOW UP; Future  2. Diet controlled DM Reviewed BS log which shows excellent control.   Term labor symptoms and general obstetric precautions including but not limited to vaginal bleeding, contractions, leaking of fluid and fetal movement were reviewed in detail with the patient. Please refer to After Visit Summary for other  counseling recommendations.  Return in about 1 week (around 01/27/2017) for Routine prenatal care.   Federico FlakeKimberly Niles Taliya Mcclard, MD

## 2017-01-28 ENCOUNTER — Ambulatory Visit (INDEPENDENT_AMBULATORY_CARE_PROVIDER_SITE_OTHER): Payer: Medicaid Other | Admitting: Obstetrics and Gynecology

## 2017-01-28 VITALS — BP 129/78 | HR 85 | Wt 217.0 lb

## 2017-01-28 DIAGNOSIS — O0993 Supervision of high risk pregnancy, unspecified, third trimester: Secondary | ICD-10-CM

## 2017-01-28 DIAGNOSIS — O24410 Gestational diabetes mellitus in pregnancy, diet controlled: Secondary | ICD-10-CM

## 2017-01-28 NOTE — Progress Notes (Signed)
Prenatal Visit Note Date: 01/28/2017 Clinic: [facility]  Subjective:  Patricia Wyatt is a 21 y.o. G1P0 at 5248w1d being seen today for ongoing prenatal care.  She is currently monitored for the following issues for this high-risk pregnancy and has Supervision of high risk pregnancy, antepartum, third trimester; Obesity in pregnancy; and GDM (gestational diabetes mellitus) on her problem list.  Patient reports no complaints.   Contractions: Irregular. Vag. Bleeding: None.  Movement: Present. Denies leaking of fluid.   The following portions of the patient's history were reviewed and updated as appropriate: allergies, current medications, past family history, past medical history, past social history, past surgical history and problem list. Problem list updated.  Objective:   Vitals:   01/28/17 1108  BP: 129/78  Pulse: 85  Weight: 217 lb (98.4 kg)    Fetal Status: Fetal Heart Rate (bpm): 135 Fundal Height: 38 cm Movement: Present  Presentation: Vertex  General:  Alert, oriented and cooperative. Patient is in no acute distress.  Skin: Skin is warm and dry. No rash noted.   Cardiovascular: Normal heart rate noted  Respiratory: Normal respiratory effort, no problems with respiration noted  Abdomen: Soft, gravid, appropriate for gestational age. Pain/Pressure: Present     Pelvic:  Cervical exam deferred        Extremities: Normal range of motion.  Edema: Trace  Mental Status: Normal mood and affect. Normal behavior. Normal judgment and thought content.   Urinalysis:      Assessment and Plan:  Pregnancy: G1P0 at 2248w1d  1. Diet controlled gestational diabetes mellitus (GDM) in third trimester Normal BS log today. Growth u/s sometime in the next week or two  2. Supervision of high risk pregnancy, antepartum, third trimester D/w pt re: BC nv.   Term labor symptoms and general obstetric precautions including but not limited to vaginal bleeding, contractions, leaking of  fluid and fetal movement were reviewed in detail with the patient. Please refer to After Visit Summary for other counseling recommendations.  Return in about 1 week (around 02/04/2017).   [place] BingPickens, Yvonne Stopher, MD

## 2017-02-03 ENCOUNTER — Ambulatory Visit (HOSPITAL_COMMUNITY)
Admission: RE | Admit: 2017-02-03 | Discharge: 2017-02-03 | Disposition: A | Discharge status: Home / Self Care | Payer: Medicaid Other | Source: Ambulatory Visit | Attending: Family Medicine | Admitting: Family Medicine

## 2017-02-03 DIAGNOSIS — O0993 Supervision of high risk pregnancy, unspecified, third trimester: Secondary | ICD-10-CM

## 2017-02-03 DIAGNOSIS — Z3A38 38 weeks gestation of pregnancy: Secondary | ICD-10-CM | POA: Insufficient documentation

## 2017-02-03 DIAGNOSIS — O24410 Gestational diabetes mellitus in pregnancy, diet controlled: Secondary | ICD-10-CM | POA: Insufficient documentation

## 2017-02-04 ENCOUNTER — Ambulatory Visit (INDEPENDENT_AMBULATORY_CARE_PROVIDER_SITE_OTHER): Payer: Medicaid Other | Admitting: Obstetrics and Gynecology

## 2017-02-04 VITALS — BP 138/88 | HR 92 | Wt 218.0 lb

## 2017-02-04 DIAGNOSIS — O24410 Gestational diabetes mellitus in pregnancy, diet controlled: Secondary | ICD-10-CM

## 2017-02-04 DIAGNOSIS — O0993 Supervision of high risk pregnancy, unspecified, third trimester: Secondary | ICD-10-CM

## 2017-02-04 NOTE — Progress Notes (Signed)
   PRENATAL VISIT NOTE  Subjective:  Patricia Wyatt is a 21 y.o. G1P0 at [redacted]w[redacted]d being seen today for ongoing prenatal care.  She is currently monitored for the following issues for this high-risk pregnancy and has Supervision of high risk pregnancy, antepartum, third trimester; Obesity in pregnancy; and GDM (gestational diabetes mellitus) on her problem list.  Patient reports no complaints.  Contractions: Irregular. Vag. Bleeding: None.  Movement: Present. Denies leaking of fluid.   The following portions of the patient's history were reviewed and updated as appropriate: allergies, current medications, past family history, past medical history, past social history, past surgical history and problem list. Problem list updated.  Objective:   Vitals:   02/04/17 1056  BP: 138/88  Pulse: 92  Weight: 218 lb (98.9 kg)    Fetal Status: Fetal Heart Rate (bpm): 144 Fundal Height: 39 cm Movement: Present     General:  Alert, oriented and cooperative. Patient is in no acute distress.  Skin: Skin is warm and dry. No rash noted.   Cardiovascular: Normal heart rate noted  Respiratory: Normal respiratory effort, no problems with respiration noted  Abdomen: Soft, gravid, appropriate for gestational age.  Pain/Pressure: Present     Pelvic: Cervical exam deferred        Extremities: Normal range of motion.  Edema: Trace  Mental Status:  Normal mood and affect. Normal behavior. Normal judgment and thought content.   Assessment and Plan:  Pregnancy: G1P0 at [redacted]w[redacted]d  1. Supervision of high risk pregnancy, antepartum, third trimester Patient is doing well Culture results reviewed with the patient  2. Diet controlled gestational diabetes mellitus (GDM) in third trimester Patient did not bring log book but reports highest fasting 93 and highest pp 117 Reviewed 8/22 ultrasound reports with [facility] 3806gm >90%tile Discussed risk of shoulder dystocia and close monitoring of labor curve  Term labor symptoms and  general obstetric precautions including but not limited to vaginal bleeding, contractions, leaking of fluid and fetal movement were reviewed in detail with the patient. Please refer to After Visit Summary for other counseling recommendations.  Return in about 1 week (around 02/11/2017) for ROB.   Catalina Antigua, MD

## 2017-02-05 ENCOUNTER — Telehealth (HOSPITAL_COMMUNITY): Payer: Self-pay | Admitting: *Deleted

## 2017-02-05 NOTE — Telephone Encounter (Signed)
Preadmission screen  

## 2017-02-08 ENCOUNTER — Other Ambulatory Visit: Payer: Self-pay | Admitting: Obstetrics and Gynecology

## 2017-02-11 ENCOUNTER — Inpatient Hospital Stay (HOSPITAL_COMMUNITY)
Admission: AD | Admit: 2017-02-11 | Discharge: 2017-02-15 | DRG: 775 | Disposition: A | Discharge status: Home / Self Care | Payer: Medicaid Other | Source: Ambulatory Visit | Attending: Obstetrics & Gynecology | Admitting: Obstetrics & Gynecology

## 2017-02-11 ENCOUNTER — Encounter (HOSPITAL_COMMUNITY): Payer: Self-pay | Admitting: *Deleted

## 2017-02-11 ENCOUNTER — Ambulatory Visit (INDEPENDENT_AMBULATORY_CARE_PROVIDER_SITE_OTHER): Payer: Medicaid Other | Admitting: Obstetrics and Gynecology

## 2017-02-11 VITALS — BP 143/87 | HR 102 | Wt 220.4 lb

## 2017-02-11 DIAGNOSIS — O24420 Gestational diabetes mellitus in childbirth, diet controlled: Secondary | ICD-10-CM | POA: Diagnosis present

## 2017-02-11 DIAGNOSIS — Z87891 Personal history of nicotine dependence: Secondary | ICD-10-CM

## 2017-02-11 DIAGNOSIS — O134 Gestational [pregnancy-induced] hypertension without significant proteinuria, complicating childbirth: Secondary | ICD-10-CM | POA: Diagnosis present

## 2017-02-11 DIAGNOSIS — O3663X0 Maternal care for excessive fetal growth, third trimester, not applicable or unspecified: Secondary | ICD-10-CM | POA: Diagnosis present

## 2017-02-11 DIAGNOSIS — Z3A39 39 weeks gestation of pregnancy: Secondary | ICD-10-CM | POA: Diagnosis not present

## 2017-02-11 DIAGNOSIS — O24429 Gestational diabetes mellitus in childbirth, unspecified control: Secondary | ICD-10-CM | POA: Diagnosis not present

## 2017-02-11 DIAGNOSIS — O0993 Supervision of high risk pregnancy, unspecified, third trimester: Secondary | ICD-10-CM

## 2017-02-11 DIAGNOSIS — O139 Gestational [pregnancy-induced] hypertension without significant proteinuria, unspecified trimester: Secondary | ICD-10-CM | POA: Insufficient documentation

## 2017-02-11 DIAGNOSIS — O24419 Gestational diabetes mellitus in pregnancy, unspecified control: Secondary | ICD-10-CM | POA: Diagnosis present

## 2017-02-11 DIAGNOSIS — O24410 Gestational diabetes mellitus in pregnancy, diet controlled: Secondary | ICD-10-CM

## 2017-02-11 DIAGNOSIS — O3660X0 Maternal care for excessive fetal growth, unspecified trimester, not applicable or unspecified: Secondary | ICD-10-CM | POA: Insufficient documentation

## 2017-02-11 DIAGNOSIS — O133 Gestational [pregnancy-induced] hypertension without significant proteinuria, third trimester: Secondary | ICD-10-CM

## 2017-02-11 HISTORY — DX: Gestational (pregnancy-induced) hypertension without significant proteinuria, unspecified trimester: O13.9

## 2017-02-11 LAB — CBC
HCT: 34.6 % — ABNORMAL LOW (ref 36.0–46.0)
HEMOGLOBIN: 11.4 g/dL — AB (ref 12.0–15.0)
MCH: 26.3 pg (ref 26.0–34.0)
MCHC: 32.9 g/dL (ref 30.0–36.0)
MCV: 79.9 fL (ref 78.0–100.0)
Platelets: 176 10*3/uL (ref 150–400)
RBC: 4.33 MIL/uL (ref 3.87–5.11)
RDW: 14.3 % (ref 11.5–15.5)
WBC: 12.3 10*3/uL — ABNORMAL HIGH (ref 4.0–10.5)

## 2017-02-11 LAB — COMPREHENSIVE METABOLIC PANEL
ALK PHOS: 131 U/L — AB (ref 38–126)
ALT: 6 U/L — AB (ref 14–54)
AST: 14 U/L — ABNORMAL LOW (ref 15–41)
Albumin: 2.8 g/dL — ABNORMAL LOW (ref 3.5–5.0)
Anion gap: 9 (ref 5–15)
BUN: 8 mg/dL (ref 6–20)
CALCIUM: 8.4 mg/dL — AB (ref 8.9–10.3)
CO2: 21 mmol/L — AB (ref 22–32)
CREATININE: 0.61 mg/dL (ref 0.44–1.00)
Chloride: 106 mmol/L (ref 101–111)
Glucose, Bld: 89 mg/dL (ref 65–99)
Potassium: 3.8 mmol/L (ref 3.5–5.1)
Sodium: 136 mmol/L (ref 135–145)
Total Protein: 6.4 g/dL — ABNORMAL LOW (ref 6.5–8.1)

## 2017-02-11 LAB — PROTEIN / CREATININE RATIO, URINE
Creatinine, Urine: 212 mg/dL
Protein Creatinine Ratio: 0.21 mg/mg{Cre} — ABNORMAL HIGH (ref 0.00–0.15)
Total Protein, Urine: 44 mg/dL

## 2017-02-11 LAB — TYPE AND SCREEN
ABO/RH(D): A POS
ANTIBODY SCREEN: NEGATIVE

## 2017-02-11 LAB — ABO/RH: ABO/RH(D): A POS

## 2017-02-11 LAB — GLUCOSE, CAPILLARY
GLUCOSE-CAPILLARY: 78 mg/dL (ref 65–99)
GLUCOSE-CAPILLARY: 81 mg/dL (ref 65–99)
Glucose-Capillary: 107 mg/dL — ABNORMAL HIGH (ref 65–99)

## 2017-02-11 MED ORDER — ACETAMINOPHEN 325 MG PO TABS
650.0000 mg | ORAL_TABLET | ORAL | Status: DC | PRN
  Administered 2017-02-12: 650 mg via ORAL
  Filled 2017-02-11: qty 2

## 2017-02-11 MED ORDER — SOD CITRATE-CITRIC ACID 500-334 MG/5ML PO SOLN: 30.0000 mL | ORAL | Status: DC | PRN

## 2017-02-11 MED ORDER — OXYCODONE-ACETAMINOPHEN 5-325 MG PO TABS: 1.0000 | ORAL_TABLET | ORAL | Status: DC | PRN

## 2017-02-11 MED ORDER — OXYCODONE-ACETAMINOPHEN 5-325 MG PO TABS: 2.0000 | ORAL_TABLET | ORAL | Status: DC | PRN

## 2017-02-11 MED ORDER — MISOPROSTOL 25 MCG QUARTER TABLET
25.0000 ug | ORAL_TABLET | ORAL | Status: DC | PRN
  Administered 2017-02-11 (×3): 25 ug via ORAL
  Filled 2017-02-11 (×5): qty 1

## 2017-02-11 MED ORDER — LACTATED RINGERS IV SOLN
INTRAVENOUS | Status: DC
  Administered 2017-02-11 – 2017-02-12 (×4): via INTRAVENOUS

## 2017-02-11 MED ORDER — OXYTOCIN BOLUS FROM INFUSION
500.0000 mL | Freq: Once | INTRAVENOUS | Status: AC
  Administered 2017-02-13: 500 mL via INTRAVENOUS

## 2017-02-11 MED ORDER — ONDANSETRON HCL 4 MG/2ML IJ SOLN
4.0000 mg | Freq: Four times a day (QID) | INTRAMUSCULAR | Status: DC | PRN
  Administered 2017-02-12: 4 mg via INTRAVENOUS
  Filled 2017-02-11: qty 2

## 2017-02-11 MED ORDER — TERBUTALINE SULFATE 1 MG/ML IJ SOLN
0.2500 mg | Freq: Once | INTRAMUSCULAR | Status: DC | PRN
  Filled 2017-02-11: qty 1

## 2017-02-11 MED ORDER — LIDOCAINE HCL (PF) 1 % IJ SOLN
30.0000 mL | INTRAMUSCULAR | Status: DC | PRN
  Filled 2017-02-11: qty 30

## 2017-02-11 MED ORDER — FENTANYL CITRATE (PF) 100 MCG/2ML IJ SOLN
100.0000 ug | INTRAMUSCULAR | Status: DC | PRN
  Administered 2017-02-11 – 2017-02-12 (×2): 100 ug via INTRAVENOUS
  Filled 2017-02-11 (×2): qty 2

## 2017-02-11 MED ORDER — LACTATED RINGERS IV SOLN: 500.0000 mL | INTRAVENOUS | Status: DC | PRN

## 2017-02-11 MED ORDER — OXYTOCIN 40 UNITS IN LACTATED RINGERS INFUSION - SIMPLE MED
2.5000 [IU]/h | INTRAVENOUS | Status: DC
  Filled 2017-02-11 (×2): qty 1000

## 2017-02-11 MED ORDER — ZOLPIDEM TARTRATE 5 MG PO TABS
5.0000 mg | ORAL_TABLET | Freq: Every evening | ORAL | Status: DC | PRN
  Administered 2017-02-11: 5 mg via ORAL
  Filled 2017-02-11: qty 1

## 2017-02-11 NOTE — H&P (Signed)
OBSTETRIC ADMISSION HISTORY AND PHYSICAL  Patricia Wyatt is a 21 y.o. female G1P0 with IUP at 68w1dby LMP presenting for induction of labor for LGA, GDMA1 and gHTN. She reports +FMs, No LOF, no VB.  Her GDM is diet controlled and she reports fasting blood sugar readings <95 and post prandial readings <120.  She had two elevated BP in office today of 140s/90s; none documented elevated prior.  She does endorse visual scomata for the past week.  She denies headache, RUQ pain, or worsening edema.  She plans on breast feeding. She requests nexplanon for birth control.   [facility]   Dating: By  LMP >  Estimated Date of Delivery: 02/17/17  Sono:   G1P0 @ 365w0dCWD, normal anatomy, cephalic presentation, placenta posterior above os, 3806g, >90% EFW (abd circ 97%)  Prenatal History/Complications: Patient Active Problem List   Diagnosis Date Noted  . LGA (large for gestational age) fetus affecting management of mother 02/11/2017  . Gestational hypertension 02/11/2017  . GDM (gestational diabetes mellitus) 12/06/2016  . Supervision of high risk pregnancy, antepartum, third trimester 08/20/2016  . Obesity in pregnancy 08/20/2016   Past Medical History: Past Medical History:  Diagnosis Date  . Gestational diabetes     Past Surgical History: Past Surgical History:  Procedure Laterality Date  . NO PAST SURGERIES      Obstetrical History: OB History    Gravida Para Term Preterm AB Living   1             SAB TAB Ectopic Multiple Live Births                  Social History: Social History   Social History  . Marital status: Single    Spouse name: N/A  . Number of children: N/A  . Years of education: N/A   Social History Main Topics  . Smoking status: Former Smoker    Types: Cigarettes    Quit date: 07/16/2016  . Smokeless tobacco: Never Used     Comment: Stopped smoking when found out about pregnancy  . Alcohol use Yes     Comment: Pt stopeed  when she found out she was pregnanct  . Drug use: Yes    Types: Marijuana     Comment: Stopped smoking when found out she was pregnant  . Sexual activity: Yes    Birth control/ protection: None   Other Topics Concern  . None   Social History Narrative  . None    Family History: Family History  Problem Relation Age of Onset  . Lung cancer Maternal Grandfather     Allergies: Allergies  Allergen Reactions  . Chamomile Swelling and Rash    Prescriptions Prior to Admission  Medication Sig Dispense Refill Last Dose  . ACCU-CHEK FASTCLIX LANCETS MISC 1 Units by Percutaneous route 4 (four) times daily. 100 each 12 Taking  . ACCU-CHEK SOFTCLIX LANCETS lancets Use as instructed, to check blood sugars 4 times daily. 120 each 12 Taking  . acetaminophen (TYLENOL) 325 MG tablet Take 650 mg by mouth every 6 (six) hours as needed.   Taking  . Blood Glucose Monitoring Suppl (ACCU-CHEK AVIVA PLUS) w/Device KIT 1 Device by Does not apply route 4 (four) times daily. 1 kit 0 Taking  . docusate sodium (COLACE) 100 MG capsule Take 1 capsule (100 mg total) by mouth 2 (two) times daily. (Patient not taking: Reported on 02/11/2017) 60 capsule 0 Not Taking  .  Elastic Bandages & Supports (COMFORT FIT MATERNITY SUPP SM) MISC 1 Units by Does not apply route daily as needed. 1 each 0 Taking  . glucose blood (ACCU-CHEK AVIVA PLUS) test strip Use as instructed 100 each 12 Taking  . glucose blood test strip Use as instructed 100 each 12 Taking  . glucose blood test strip Use as instructed 100 each 12 Taking  . Prenatal Vit-Fe Fumarate-FA (MULTIVITAMIN-PRENATAL) 27-0.8 MG TABS tablet Take 1 tablet by mouth daily at 12 noon.   Taking  . promethazine (PHENERGAN) 12.5 MG tablet Take 1 tablet (12.5 mg total) by mouth every 6 (six) hours as needed for nausea or vomiting. (Patient not taking: Reported on 02/11/2017) 30 tablet 0 Not Taking     Review of Systems   All systems reviewed and negative except as stated in  HPI  Blood pressure 133/88, pulse 95, temperature 98.3 F (36.8 C), temperature source Oral, resp. rate 18, height _0  (1.727 m), weight 99.8 kg (220 lb), last menstrual period 05/13/2016. General appearance: alert and cooperative Lungs: clear to auscultation bilaterally Heart: regular rate and rhythm Abdomen: soft, non-tender; bowel sounds normal Pelvic: deferred Extremities: Homans sign is negative, no sign of DVT DTR's 2+ b/l Presentation: cephalic Fetal monitoringBaseline: 145 bpm, moderate variability, +accels, no decels Uterine activity: irritability Dilation: 1 Effacement (%): 50 Station: -3 Exam by:: k fields, rn   Prenatal labs: ABO, Rh: A/Positive/-- (03/08 1129) Antibody: Negative (03/08 1129) Rubella: 12.20 (03/08 1129) RPR: Non Reactive (06/22 0811)  HBsAg: Negative (03/08 1129)  HIV:   Non Reactive (12/04/16) GBS: Negative (08/09 0000)  2hr GTT: FBS 94, 1hr 129, 2hr 125 Genetic screening  N/A Anatomy US: girl, no fetal abnormalities, AC 97%ile  Prenatal Transfer Tool  Maternal Diabetes: Yes:  Diabetes Type:  Diet controlled Genetic Screening: Declined Maternal Ultrasounds/Referrals: Abnormal:  Findings:   Other: LGA Fetal Ultrasounds or other Referrals:  None Maternal Substance Abuse:  No Significant Maternal Medications:  None Significant Maternal Lab Results: None  Results for orders placed or performed during the hospital encounter of 02/11/17 (from the past 24 hour(s))  CBC   Collection Time: 02/11/17  2:25 PM  Result Value Ref Range   WBC 12.3 (H) 4.0 - 10.5 K/uL   RBC 4.33 3.87 - 5.11 MIL/uL   Hemoglobin 11.4 (L) 12.0 - 15.0 g/dL   HCT 34.6 (L) 36.0 - 46.0 %   MCV 79.9 78.0 - 100.0 fL   MCH 26.3 26.0 - 34.0 pg   MCHC 32.9 30.0 - 36.0 g/dL   RDW 14.3 11.5 - 15.5 %   Platelets 176 150 - 400 K/uL    Patient Active Problem List   Diagnosis Date Noted  . LGA (large for gestational age) fetus affecting management of mother 02/11/2017  .  Gestational hypertension 02/11/2017  . GDM (gestational diabetes mellitus) 12/06/2016  . Supervision of high risk pregnancy, antepartum, third trimester 08/20/2016  . Obesity in pregnancy 08/20/2016    Assessment/Plan:  Patricia Wyatt is a 21 y.o. G1P0 at 47w1dhere for induction of labor secondary to diet controlled GDM, LGA and gHTN.   #GDM: Diet controlled, monitor BS q4hrs in latent labor and transition to q2hr in active labor  #gHTN: Two readings in office today >140/90, no severe range; given visual scomata as well for last week, will obtain PIH labs; monitor BP q1hr for now, monitor for onset of severe features #Labor: Cytotec PO, will reassess for possible foley bulb #Pain: Fentanyl or Nitrous Oxide PRN,  also open to epidural if needed #FWB: Category 1 #ID:  GBS negative #MOF: Breast #MOC: Nexplanon  #Circ:  N/A  Garvin Fila Key, DO  02/11/2017, 3:18 PM  OB FELLOW HISTORY AND PHYSICAL ATTESTATION  I confirm that I have verified the information documented in the resident's note and that I have also personally reperformed the physical exam and all medical decision making activities. I agree with above documentation and have made edits as needed.   Luiz Blare OB Fellow 02/11/2017, 6:15 PM

## 2017-02-11 NOTE — Progress Notes (Signed)
Prenatal Visit Note Date: 02/11/2017 [facility]  Subjective:  Patricia Wyatt is a 21 y.o. G1P0 at 7672w1d being seen today for ongoing prenatal care.  She is currently monitored for the following issues for this high-risk pregnancy and has Supervision of high risk pregnancy, antepartum, third trimester; Obesity in pregnancy; GDM (gestational diabetes mellitus); LGA (large for gestational age) fetus affecting management of mother; and Gestational hypertension on her problem list.  Patient reports no s/s of pre-eclampsia.   Contractions: Irregular. Vag. Bleeding: None.  Movement: Present. Denies leaking of fluid.   The following portions of the patient's history were reviewed and updated as appropriate: allergies, current medications, past family history, past medical history, past social history, past surgical history and problem list. Problem list updated.  Objective:   Vitals:   02/11/17 1112 02/11/17 1124  BP: (!) 144/94 (!) 143/87  Pulse: (!) 102   Weight: 220 lb 6.4 oz (100 kg)     Fetal Status: Fetal Heart Rate (bpm): 138   Movement: Present  Presentation: Vertex  General:  Alert, oriented and cooperative. Patient is in no acute distress.  Skin: Skin is warm and dry. No rash noted.   Cardiovascular: Normal heart rate noted. Normal s1 and s2  Respiratory: Normal respiratory effort, no problems with respiration noted. CTAB  Abdomen: Soft, gravid, appropriate for gestational age. Pain/Pressure: Present     Pelvic:  Cervical exam performed Dilation: 1 Effacement (%): 50 Station: Ballotable  Extremities: Normal range of motion.  Edema: Trace  Mental Status: Normal mood and affect. Normal behavior. Normal judgment and thought content.   Neuro: 1+ brachial b/l  Urinalysis:      Assessment and Plan:  Pregnancy: G1P0 at 5072w1d  1. Excessive fetal growth affecting management of pregnancy, antepartum, single or unspecified fetus Keep close eye on  labor curve and for shoulder dystocia  2. Supervision of high risk pregnancy, antepartum, third trimester  3. Diet controlled gestational diabetes mellitus (GDM) in third trimester Normal BS log. LGA fetus Rpt f/u BS testing at least 6wks PP  4. Pregnancy-induced hypertension in third trimester D/w on call and will admit to L&D and bypass MAU; L&D aware and okay with beds. Pt to go home, eat light lunch and bring bag and support to hospital early this afternoon.  Term labor symptoms and general obstetric precautions including but not limited to vaginal bleeding, contractions, leaking of fluid and fetal movement were reviewed in detail with the patient. Please refer to After Visit Summary for other counseling recommendations.  Return in about 5 weeks (around 03/18/2017) for PP visit.   [place] BingPickens, Silva Aamodt, MD

## 2017-02-11 NOTE — Anesthesia Pain Management Evaluation Note (Signed)
  CRNA Pain Management Visit Note  Patient: Patricia Wyatt, 21 y.o., female  "Hello [facility] am [facility]. We have an anesthesia team available at all times to provide care throughout the hospital, including epidural management and anesthesia for C-section. I don't know your plan for the delivery whether it a natural birth, water birth, IV sedation, nitrous supplementation, doula or epidural, but we want to meet your pain goals."   1.Was your pain managed to your expectations on prior hospitalizations?   No prior hospitalizations  2.What is your expectation for pain management during this hospitalization?     Labor support without medications, Epidural, IV pain meds and Nitrous Oxide  3.How can we help you reach that goal? First labor experience. Pt was open to discussion of all methods of pain management. All questions answered.  Record the patient's initial score and the patient's pain goal.   Pain: 0  Pain Goal: 8 [facility] wants you to be able to say your pain was always managed very well.  Patricia Wyatt 02/11/2017

## 2017-02-11 NOTE — Progress Notes (Signed)
Vitals:   02/11/17 1918 02/11/17 2004  BP: (!) 135/92 131/90  Pulse: 96 75  Resp: 18 18  Temp:     No c/o.  No HA, vision changes, RUQ pain. PreX labs normal so far, waiting on urine to be collected for pr/.cr ratio.  Foley still in, FHR cat 1, irregular and mild Ctx.  BS 81 Foley still in.

## 2017-02-11 NOTE — Progress Notes (Signed)
Patricia Wyatt is a 21 y.o. G1P0 at 5833w1d by LMP admitted for induction of labor due to Gestational diabetes, Hypertension and LGA.  Subjective: Doing well.  No contractions.  No LOF.  Endorses adequate fetal movement.   Objective: BP (!) 137/103   Pulse 84   Temp 98.3 F (36.8 C) (Oral)   Resp 20   Ht 5\' 8"  (1.727 m)   Wt 99.8 kg (220 lb)   LMP 05/13/2016 (Exact Date)   BMI 33.45 kg/m  No intake/output data recorded. No intake/output data recorded.  FHT:  FHR: 145 bpm, variability: moderate,  accelerations:  Present,  decelerations:  Absent UC:   none SVE:   Dilation: 1 Effacement (%): 50 Station: -3 Exam by:: resident  Labs: Lab Results  Component Value Date   WBC 12.3 (H) 02/11/2017   HGB 11.4 (L) 02/11/2017   HCT 34.6 (L) 02/11/2017   MCV 79.9 02/11/2017   PLT 176 02/11/2017    Assessment / Plan: G1P0 @ 3433w1d admitted for IOL secondary to gestational diabetes-diet controlled, gestational hypertension and LGA.    Labor: No progress with one dose of oral cytotec; placed foley bulb successfully  Preeclampsia:  no signs or symptoms of toxicity; PIH labs pending, BP have been within normal range Fetal Wellbeing:  Category I Pain Control:  Labor support without medications for now; plan for possible fentanyl, nitrous oxide or epidural when desired I/D:  n/a Anticipated MOD:  NSVD  Patricia LeschMary K Deontre Wyatt 02/11/2017, 6:04 PM

## 2017-02-12 ENCOUNTER — Inpatient Hospital Stay (HOSPITAL_COMMUNITY): Payer: Medicaid Other | Admitting: Anesthesiology

## 2017-02-12 LAB — CBC
HCT: 33.9 % — ABNORMAL LOW (ref 36.0–46.0)
Hemoglobin: 10.9 g/dL — ABNORMAL LOW (ref 12.0–15.0)
MCH: 25.8 pg — ABNORMAL LOW (ref 26.0–34.0)
MCHC: 32.2 g/dL (ref 30.0–36.0)
MCV: 80.3 fL (ref 78.0–100.0)
Platelets: 147 10*3/uL — ABNORMAL LOW (ref 150–400)
RBC: 4.22 MIL/uL (ref 3.87–5.11)
RDW: 14.4 % (ref 11.5–15.5)
WBC: 13 10*3/uL — AB (ref 4.0–10.5)

## 2017-02-12 LAB — GLUCOSE, CAPILLARY
GLUCOSE-CAPILLARY: 70 mg/dL (ref 65–99)
Glucose-Capillary: 87 mg/dL (ref 65–99)
Glucose-Capillary: 77 mg/dL (ref 65–99)
Glucose-Capillary: 72 mg/dL (ref 65–99)
Glucose-Capillary: 73 mg/dL (ref 65–99)

## 2017-02-12 MED ORDER — EPHEDRINE 5 MG/ML INJ
10.0000 mg | INTRAVENOUS | Status: DC | PRN
  Filled 2017-02-12: qty 2

## 2017-02-12 MED ORDER — DIPHENHYDRAMINE HCL 50 MG/ML IJ SOLN: 12.5000 mg | INTRAMUSCULAR | Status: DC | PRN

## 2017-02-12 MED ORDER — MINERAL OIL PO OIL
TOPICAL_OIL | Freq: Once | ORAL | Status: AC
  Administered 2017-02-12: 30 mL

## 2017-02-12 MED ORDER — LACTATED RINGERS IV SOLN
500.0000 mL | Freq: Once | INTRAVENOUS | Status: AC
  Administered 2017-02-12: 500 mL via INTRAVENOUS

## 2017-02-12 MED ORDER — LIDOCAINE HCL (PF) 1 % IJ SOLN
INTRAMUSCULAR | Status: DC | PRN
  Administered 2017-02-12 (×2): 5 mL via EPIDURAL

## 2017-02-12 MED ORDER — TERBUTALINE SULFATE 1 MG/ML IJ SOLN
0.2500 mg | Freq: Once | INTRAMUSCULAR | Status: DC | PRN
  Filled 2017-02-12: qty 1

## 2017-02-12 MED ORDER — PHENYLEPHRINE 40 MCG/ML (10ML) SYRINGE FOR IV PUSH (FOR BLOOD PRESSURE SUPPORT)
80.0000 ug | PREFILLED_SYRINGE | INTRAVENOUS | Status: DC | PRN
  Filled 2017-02-12: qty 5

## 2017-02-12 MED ORDER — PHENYLEPHRINE 40 MCG/ML (10ML) SYRINGE FOR IV PUSH (FOR BLOOD PRESSURE SUPPORT)
80.0000 ug | PREFILLED_SYRINGE | INTRAVENOUS | Status: DC | PRN
  Filled 2017-02-12: qty 10
  Filled 2017-02-12: qty 5

## 2017-02-12 MED ORDER — OXYTOCIN 40 UNITS IN LACTATED RINGERS INFUSION - SIMPLE MED
1.0000 m[IU]/min | INTRAVENOUS | Status: DC
  Administered 2017-02-12: 2 m[IU]/min via INTRAVENOUS
  Administered 2017-02-13: 41.667 m[IU]/min via INTRAVENOUS

## 2017-02-12 MED ORDER — FENTANYL 2.5 MCG/ML BUPIVACAINE 1/10 % EPIDURAL INFUSION (WH - ANES)
14.0000 mL/h | INTRAMUSCULAR | Status: DC | PRN
  Administered 2017-02-12 (×2): 14 mL/h via EPIDURAL
  Filled 2017-02-12 (×2): qty 100

## 2017-02-12 NOTE — Anesthesia Procedure Notes (Signed)
Epidural Patient location during procedure: OB Start time: 02/12/2017 3:30 PM End time: 02/12/2017 4:40 PM  Staffing Anesthesiologist: Kevontae Burgoon  Preanesthetic Checklist Completed: patient identified, site marked, surgical consent, pre-op evaluation, timeout performed, IV checked, risks and benefits discussed and monitors and equipment checked  Epidural Patient position: sitting Prep: site prepped and draped and DuraPrep Patient monitoring: continuous pulse ox and blood pressure Approach: midline Location: L3-L4 Injection technique: LOR air  Needle:  Needle type: Tuohy  Needle gauge: 17 G Needle length: 9 cm and 9 Needle insertion depth: 8 cm Catheter type: closed end flexible Catheter size: 19 Gauge Catheter at skin depth: 13 cm Test dose: negative  Assessment Events: blood not aspirated, injection not painful, no injection resistance, negative IV test and no paresthesia

## 2017-02-12 NOTE — Anesthesia Procedure Notes (Signed)
Procedures

## 2017-02-12 NOTE — Progress Notes (Signed)
Labor Progress Note  S: Patient seen & examined for progress of labor. Patient comfortable but says she sometimes feels a little pain in her back.   O: BP 123/79   Pulse 74   Temp 97.6 F (36.4 C) (Oral)   Resp 18   Ht 5\' 8"  (1.727 m)   Wt 99.8 kg (220 lb)   LMP 05/13/2016 (Exact Date)   BMI 33.45 kg/m   FHT: 140bpm, mod var, +accels, no decels TOCO: q2-553min, patient looks comfortable during contractions  CVE: Dilation: 4 Effacement (%): 50 Cervical Position: Posterior Station: -2 Presentation: Vertex Exam by:: Cher NakaiKristin McLeod RN   A&P: 21 y.o. G1P0 824w2d here for IOL for LGA, GDMA1, and gHTN.  Blood sugars have been well controlled, consistently <100.  Will continue to monitor Q4H Blood pressure has been normal for last 12 hours. Currently on 5516milli-units of pitocin. Patient not yet in labor due to lack of cervical change over the last 8 hours and lack of intensity of contractions as reported by patient.  Will continue to augment with pitocin. Continue induction. Anticipate SVD  Lezlie OctaveAmanda Winfrey, MD [facility] Resident PGY-1 02/12/2017 10:19 AM

## 2017-02-12 NOTE — Progress Notes (Signed)
Labor Progress Note  S: Patient seen & examined for progress of labor. Patient says she is uncomfortable and is feeling cramping pain every few minutes.  She is interested in using nitrous oxide for pain relief.  O: BP 139/82   Pulse 75   Temp 98.3 F (36.8 C) (Oral)   Resp 18   Ht 5\' 8"  (1.727 m)   Wt 99.8 kg (220 lb)   LMP 05/13/2016 (Exact Date)   BMI 33.45 kg/m   FHT: 130bpm, mod var, +accels, no decels TOCO: infrequent, patient looks uncomfortable during contractions  CVE: Dilation: 4 Effacement (%): 50 Cervical Position: Posterior Station: -2 Presentation: Vertex Exam by:: Cher NakaiKristin McLeod RN   A&P: 21 y.o. G1P0 3440w2d here for IOL for LGA, GDMA1, and gHTN.  Will give patient nitrous oxide for pain relief. Blood sugars have been well controlled, consistently <100.  Will continue to monitor Q4H Blood pressure has been normal for last 12 hours. Currently on 6620milli-units of pitocin. Will continue to augment with pitocin. Continue induction. Anticipate SVD  Lezlie OctaveAmanda Myisha Pickerel, MD FM Resident PGY-1 02/12/2017 2:13 PM

## 2017-02-12 NOTE — Progress Notes (Signed)
Labor Progress Note  Patricia NorfolkXena Wyatt is a 21 y.o. female G1P0 with IUP at 5348w1d by LMP presenting for induction of labor for LGA, GDMA1 and gHTN.   S: Patient seen & examined for progress of labor. Patient has been pushing for about an hour and a half. Patient is very uncomfortable with the pressure she is feeling but she is breathing through it. She says the epidural is working in that she can't feel the contractions.   O: BP 130/70   Pulse 80   Temp 97.6 F (36.4 C) (Oral)   Resp 18   Ht 5\' 8"  (1.727 m)   Wt 99.8 kg (220 lb)   LMP 05/13/2016 (Exact Date)   SpO2 100%   BMI 33.45 kg/m   FHT: 165bpm, mod var, +accels, no decels TOCO: frequent, patient is in mild distressed  CVE: Dilation: 10 Dilation Complete Date: 02/12/17 Dilation Complete Time: 2100 Effacement (%): 100 Cervical Position: Middle Station: +2 Presentation: Vertex Exam by:: L.Stubbs, RN  A&P: 21 y.o. G1P0 76101w2d here for LGA, GDMA1 and gHTN  Labor:Last pitocin @ 2030 22 milli-units Fetal Wellbeing:Cat I Pain Control: Epidural Anticipated WUJ:WJXBOD:NSVD  Expecting delivery soon, continue expectant management   Patricia Wyatt Medical Student 02/12/2017 10:27 PM   I confirm that I have verified the information documented in the medical student's note and that I have also personally reperformed the physical exam and all medical decision making activities.  Cam HaiSHAW, Lewanda Perea CNM 02/13/2017 2:09 AM

## 2017-02-12 NOTE — Anesthesia Preprocedure Evaluation (Signed)
Anesthesia Evaluation  Patient identified by MRN, date of birth, ID band Patient awake    Reviewed: Allergy & Precautions, H&P , NPO status , Patient's Chart, lab work & pertinent test results, reviewed documented beta blocker date and time   Airway Mallampati: II  TM Distance: >3 FB Neck ROM: full    Dental no notable dental hx.    Pulmonary neg pulmonary ROS, former smoker,    Pulmonary exam normal breath sounds clear to auscultation       Cardiovascular negative cardio ROS Normal cardiovascular exam Rhythm:regular Rate:Normal     Neuro/Psych negative neurological ROS  negative psych ROS   GI/Hepatic negative GI ROS, Neg liver ROS,   Endo/Other  negative endocrine ROSdiabetes  Renal/GU negative Renal ROS  negative genitourinary   Musculoskeletal   Abdominal   Peds  Hematology negative hematology ROS (+)   Anesthesia Other Findings   Reproductive/Obstetrics (+) Pregnancy                             Anesthesia Physical Anesthesia Plan  ASA: II  Anesthesia Plan: Epidural   Post-op Pain Management:    Induction:   PONV Risk Score and Plan:   Airway Management Planned:   Additional Equipment:   Intra-op Plan:   Post-operative Plan:   Informed Consent: I have reviewed the patients History and Physical, chart, labs and discussed the procedure including the risks, benefits and alternatives for the proposed anesthesia with the patient or authorized representative who has indicated his/her understanding and acceptance.     Plan Discussed with:   Anesthesia Plan Comments:         Anesthesia Quick Evaluation

## 2017-02-12 NOTE — Progress Notes (Signed)
   Patricia NorfolkXena Quesnell is a 21 y.o. G1P0 at 3779w2d  admitted for induction of labor due to Gestational diabetes and Hypertension.  Subjective: Ctx gettting stronger.  Denies HA, RUQ pain, vision changes.  FOley fell out around 0230  Objective: Vitals:   02/12/17 0209 02/12/17 0258 02/12/17 0334 02/12/17 0405  BP: 122/75 133/90 124/78 119/70  Pulse: 78 89 85 76  Resp: 18 18 18 18   Temp:  97.7 F (36.5 C)    TempSrc:  Oral    Weight:      Height:       No intake/output data recorded. BS 87 FHT:  FHR: 135 bpm, variability: moderate,  accelerations:  Present,  decelerations:  Absent UC:   irregular, every 2-4 minutes SVE:   Dilation: 4.5 Effacement (%): 50 Station: -3 Exam by:: Cletis MediaK. Anderson, RN Pitocin @ 6 mu/min  Labs: Lab Results  Component Value Date   WBC 12.3 (H) 02/11/2017   HGB 11.4 (L) 02/11/2017   HCT 34.6 (L) 02/11/2017   MCV 79.9 02/11/2017   PLT 176 02/11/2017    Assessment / Plan: Induction of labor due to gestational hypertension and gestational diabetes,  progressing well on pitocin  Labor: Progressing normally Fetal Wellbeing:  Category I Pain Control:  IV pain meds Anticipated MOD:  NSVD  CRESENZO-DISHMAN,Julliana Whitmyer 02/12/2017, 4:52 AM

## 2017-02-13 ENCOUNTER — Encounter (HOSPITAL_COMMUNITY): Payer: Self-pay

## 2017-02-13 DIAGNOSIS — Z3A39 39 weeks gestation of pregnancy: Secondary | ICD-10-CM

## 2017-02-13 DIAGNOSIS — O134 Gestational [pregnancy-induced] hypertension without significant proteinuria, complicating childbirth: Secondary | ICD-10-CM

## 2017-02-13 DIAGNOSIS — O24429 Gestational diabetes mellitus in childbirth, unspecified control: Secondary | ICD-10-CM

## 2017-02-13 LAB — GLUCOSE, CAPILLARY: GLUCOSE-CAPILLARY: 105 mg/dL — AB (ref 65–99)

## 2017-02-13 MED ORDER — OXYCODONE HCL 5 MG PO TABS: 5.0000 mg | ORAL_TABLET | ORAL | Status: DC | PRN

## 2017-02-13 MED ORDER — ACETAMINOPHEN 325 MG PO TABS: 650.0000 mg | ORAL_TABLET | ORAL | Status: DC | PRN

## 2017-02-13 MED ORDER — WITCH HAZEL-GLYCERIN EX PADS: 1.0000 "application " | MEDICATED_PAD | CUTANEOUS | Status: DC | PRN

## 2017-02-13 MED ORDER — COCONUT OIL OIL: 1.0000 "application " | TOPICAL_OIL | Status: DC | PRN

## 2017-02-13 MED ORDER — SIMETHICONE 80 MG PO CHEW: 80.0000 mg | CHEWABLE_TABLET | ORAL | Status: DC | PRN

## 2017-02-13 MED ORDER — BENZOCAINE-MENTHOL 20-0.5 % EX AERO
1.0000 "application " | INHALATION_SPRAY | CUTANEOUS | Status: DC | PRN
  Administered 2017-02-14: 1 via TOPICAL
  Filled 2017-02-13: qty 56

## 2017-02-13 MED ORDER — IBUPROFEN 600 MG PO TABS
600.0000 mg | ORAL_TABLET | Freq: Four times a day (QID) | ORAL | Status: DC
  Administered 2017-02-13 – 2017-02-15 (×9): 600 mg via ORAL
  Filled 2017-02-13 (×9): qty 1

## 2017-02-13 MED ORDER — ZOLPIDEM TARTRATE 5 MG PO TABS
5.0000 mg | ORAL_TABLET | Freq: Every evening | ORAL | Status: DC | PRN
Start: 2017-02-13 — End: 2017-02-15

## 2017-02-13 MED ORDER — DIBUCAINE 1 % RE OINT: 1.0000 "application " | TOPICAL_OINTMENT | RECTAL | Status: DC | PRN

## 2017-02-13 MED ORDER — ONDANSETRON HCL 4 MG/2ML IJ SOLN: 4.0000 mg | INTRAMUSCULAR | Status: DC | PRN

## 2017-02-13 MED ORDER — TETANUS-DIPHTH-ACELL PERTUSSIS 5-2.5-18.5 LF-MCG/0.5 IM SUSP: 0.5000 mL | Freq: Once | INTRAMUSCULAR | Status: DC

## 2017-02-13 MED ORDER — PRENATAL MULTIVITAMIN CH
1.0000 | ORAL_TABLET | Freq: Every day | ORAL | Status: DC
  Administered 2017-02-13 – 2017-02-14 (×2): 1 via ORAL
  Filled 2017-02-13 (×2): qty 1

## 2017-02-13 MED ORDER — ONDANSETRON HCL 4 MG PO TABS: 4.0000 mg | ORAL_TABLET | ORAL | Status: DC | PRN

## 2017-02-13 MED ORDER — DIPHENHYDRAMINE HCL 25 MG PO CAPS: 25.0000 mg | ORAL_CAPSULE | Freq: Four times a day (QID) | ORAL | Status: DC | PRN

## 2017-02-13 MED ORDER — SENNOSIDES-DOCUSATE SODIUM 8.6-50 MG PO TABS
2.0000 | ORAL_TABLET | ORAL | Status: DC
  Administered 2017-02-13 – 2017-02-14 (×2): 2 via ORAL
  Filled 2017-02-13 (×2): qty 2

## 2017-02-13 NOTE — Lactation Note (Signed)
This note was copied from a baby's chart. Lactation Consultation Note  Patient Name: Patricia Windell NorfolkXena Botto ZOXWR'UToday's Date: 02/13/2017 Reason for consult: Initial assessment  Baby 10 hours old. Mom reports that she has had some assistance with hand expression and finger feeding baby EBM. Mom states that she will call when baby cueing to [facility] with [facility]. [facility], aware of OP/BFSG and LC phone line assistance after D/C.   Maternal Data Has patient been taught Hand Expression?: Yes Does the patient have breastfeeding experience prior to this delivery?: No  Feeding Feeding Type: Breast Milk Nipple Type: Slow - flow Length of feed: 5 min  LATCH Score Latch: Grasps breast easily, tongue down, lips flanged, rhythmical sucking.  Audible Swallowing: Spontaneous and intermittent  Type of Nipple: Everted at rest and after stimulation  Comfort (Breast/Nipple): Soft / non-tender  Hold (Positioning): Assistance needed to correctly position infant at breast and maintain latch.  LATCH Score: 9  Interventions    Lactation Tools Discussed/Used     Consult Status Consult Status: Follow-up Date: 02/13/17 Follow-up type: In-patient    Sherlyn HayJennifer D Asah Lamay 02/13/2017, 10:39 AM

## 2017-02-13 NOTE — Progress Notes (Signed)
During assessment patient verbalized that her right leg was still numb. She reported that she felt light sensation from her mid shin to mid thigh. She is not sure if she ever regained all sensation since epidural. Patient is able to walk, void, and has no other symptoms. CRNA notified and will evaluate. Patricia Wyatt, Charo Philipp E, RN

## 2017-02-13 NOTE — Anesthesia Postprocedure Evaluation (Signed)
Anesthesia Post Note  Patient: Windell NorfolkXena Berrong  Procedure(s) Performed: * No procedures listed *     Patient location during evaluation: Mother Baby Anesthesia Type: Epidural Level of consciousness: awake and alert and oriented Pain management: satisfactory to patient Vital Signs Assessment: post-procedure vital signs reviewed and stable Respiratory status: spontaneous breathing and nonlabored ventilation Cardiovascular status: stable Postop Assessment: no headache, no backache, no signs of nausea or vomiting, adequate PO intake and patient able to bend at knees (patient up walking) Anesthetic complications: no    Last Vitals:  Vitals:   02/13/17 0350 02/13/17 0727  BP: 130/81 120/70  Pulse: 84 78  Resp: 18 18  Temp: 36.9 C 37.2 C  SpO2: 99% 99%    Last Pain:  Vitals:   02/13/17 0727  TempSrc: Oral  PainSc: 0-No pain   Pain Goal: Patients Stated Pain Goal: 2 (02/13/17 0045)               Madison HickmanGREGORY,Caprina Wussow

## 2017-02-13 NOTE — Lactation Note (Signed)
This note was copied from a baby's chart. Lactation Consultation Note  Patient Name: Patricia Wyatt ZOXWR'UToday's Date: 02/13/2017 Reason for consult: Follow-up assessment;Other (Comment);Maternal endocrine disorder (infant hypoglycemia.) Type of Endocrine Disorder?: Diabetes  Baby 13 hours old. Requested to see patient and assist with latch d/t baby's earlier hypoglycemia. Mom reports that she did not call earlier for a latch assist because she had company--infants older sibling in to see baby.    Baby quiet alert. Assisted mom to latch baby to right breast in football position for 1-2 minutes, and baby attempted to latch, but would not suckle with stimulation. Wrapped baby and gave to FOB to hold, then assisted mom with hand expression. Mom is easily expressible initially, with a few drops flowing bilaterally. Drops given to baby and baby tolerated well. FOB given bottle of Alimentum to supplement baby, and assisted mom to start use of DEBP.   Baby not suckling bottle well with FOB, so this LC attempted to bottle-feed and demonstrated paced feeding. However, baby only chewed the nipple and gagged a couple of times. Provided suck training with this LC's gloved finger and offered bottle several times but baby showed little improvement. Baby able to take 3 ml of formula. Baby given a couple of more drops of EBM after mom finished pumping. Mom stated that she was too tired to offer STS, so baby placed STS with FOB.   Discussed assessment and interventions with Morrie SheldonAshley, RN and Johnny BridgeMartha, RN in CN.   Assisted  Maternal Data Has patient been taught Hand Expression?: Yes Does the patient have breastfeeding experience prior to this delivery?: No  Feeding Feeding Type: Breast Fed Nipple Type: Slow - flow Length of feed: 0 min  LATCH Score Latch: Too sleepy or reluctant, no latch achieved, no sucking elicited.  Audible Swallowing: None  Type of Nipple: Everted at rest and after stimulation  Comfort  (Breast/Nipple): Soft / non-tender  Hold (Positioning): Assistance needed to correctly position infant at breast and maintain latch.  LATCH Score: 5  Interventions Interventions: Assisted with latch;Skin to skin;Breast compression;Support pillows;Adjust position;Position options;Expressed milk  Lactation Tools Discussed/Used Pump Review: Setup, frequency, and cleaning;Milk Storage Initiated by:: JW Date initiated:: 02/13/17   Consult Status Consult Status: Follow-up Date: 02/14/17 Follow-up type: In-patient    Sherlyn HayJennifer D Mikki Ziff 02/13/2017, 1:36 PM

## 2017-02-13 NOTE — Lactation Note (Signed)
This note was copied from a baby's chart. Lactation Consultation Note New mom sleeping. Baby's blood sugar dropped from 50 to 27. Baby was given formula then glucose gel per protocol. Mom has been exhausted and has slept a lot between interventions of glucose.  RN stated mom has short shaft nipples. [facility] Patient Name: Patricia Wyatt ZOXWR'UToday's Date: 02/13/2017     Maternal Data    Feeding Feeding Type: Bottle Fed - Formula Nipple Type: Slow - flow  LATCH Score Latch: Repeated attempts needed to sustain latch, nipple held in mouth throughout feeding, stimulation needed to elicit sucking reflex.  Audible Swallowing: A few with stimulation  Type of Nipple: Everted at rest and after stimulation (short shafted)  Comfort (Breast/Nipple): Soft / non-tender  Hold (Positioning): Assistance needed to correctly position infant at breast and maintain latch.  LATCH Score: 7  Interventions    Lactation Tools Discussed/Used     Consult Status      Takeesha Isley, Diamond NickelLAURA G 02/13/2017, 6:39 AM

## 2017-02-14 LAB — RPR: RPR Ser Ql: NONREACTIVE

## 2017-02-14 NOTE — Progress Notes (Signed)
Post Partum Day 1 Subjective: no complaints, up ad lib, voiding, tolerating PO and + flatus  Objective: Blood pressure 123/82, pulse 72, temperature 97.9 F (36.6 C), temperature source Oral, resp. rate 18, height 5\' 8"  (1.727 m), weight 220 lb (99.8 kg), last menstrual period 05/13/2016, SpO2 100 %, unknown if currently breastfeeding.  Physical Exam:  General: alert, cooperative and appears stated age Lochia: appropriate Uterine Fundus: firm Incision: n/a DVT Evaluation: No evidence of DVT seen on physical exam.   Recent Labs  02/11/17 1425 02/12/17 1443  HGB 11.4* 10.9*  HCT 34.6* 33.9*    Assessment/Plan: Plan for discharge tomorrow   LOS: 3 days   Patricia Wyatt 02/14/2017, 10:21 AM

## 2017-02-14 NOTE — Progress Notes (Signed)
During assessment pt verbalized her right leg continues to be numb.  States "my right leg is numb from my ankle to my mid-thigh".  Patient is able to walk to and from bathroom, no other symptoms at this time.  [facility] notified and will evaluate pt.

## 2017-02-14 NOTE — Anesthesia Rounding Note (Signed)
  [facility] Epidural Rounding Note  Patient: Patricia Wyatt, 21 y.o., female  Patient's current pain level: Pain Score: 0-No pain (02/14/17 0904)  Agreed upon pain management level: 1  Epidural intervention: Evaluation of right leg pain  Comments: Called to evaluate patient for right leg numbness. Patient states it feels like "pins and needles"  Patient can walk and support her weight without problems. Patient can bend her knee and move her leg appropriately. Discussed with Dr Renold DonGermeroth, MD. Due to positioning of legs in stirups and pushing during labor.  Patricia Wyatt,Patricia Wyatt 02/14/2017

## 2017-02-15 MED ORDER — IBUPROFEN 600 MG PO TABS: 600.0000 mg | ORAL_TABLET | Freq: Four times a day (QID) | ORAL | 0 refills | Status: AC

## 2017-02-15 NOTE — Discharge Summary (Signed)
OB Discharge Summary  Patient Name: Patricia Wyatt DOB: 04/09/1996 MRN: [id number]  Date of admission: 02/11/2017 Delivering MD: Reather Converse T   Date of discharge: 02/15/2017  Admitting diagnosis: INDUCTION Intrauterine pregnancy: [redacted]w[redacted]d    Secondary diagnosis:Active Problems:   GDM (gestational diabetes mellitus)  Additional problems:none     Discharge diagnosis: Term Pregnancy Delivered and GDM A1                                                                     Post partum procedures:n/a  Augmentation: Pitocin and Cytotec  Complications: None  Hospital course:  Induction of Labor With Vaginal Delivery   21y.o. yo G1P1001 at 361w3das admitted to the hospital 02/11/2017 for induction of labor.  Indication for induction: A1 DM.  Patient had an uncomplicated labor course as follows: Membrane Rupture Time/Date: 4:17 PM ,02/12/2017   Intrapartum Procedures: Episiotomy: None [1]                                         Lacerations:  None [1]  Patient had delivery of a Viable infant.  Information for the patient's newborn:  LaKeoni, Havey0[[id number]]Delivery Method: Vaginal, Spontaneous Delivery (Filed from Delivery Summary)   02/13/2017  Details of delivery can be found in separate delivery note.  Patient had a routine postpartum course. Patient is discharged home 02/15/17.  Physical exam  Vitals:   02/13/17 1804 02/14/17 0600 02/14/17 1845 02/15/17 0550  BP: 127/84 123/82 133/88 126/73  Pulse: 74 72 69 90  Resp: _0 Temp:  97.9 F (36.6 C) 97.9 F (36.6 C) 98.4 F (36.9 C)  TempSrc: Oral Oral Oral Oral  SpO2:  100%  98%  Weight:      Height:       General: alert, cooperative and no distress Lochia: appropriate Uterine Fundus: firm Incision: N/A DVT Evaluation: No evidence of DVT seen on physical exam. Labs: Lab Results  Component Value Date   WBC 13.0 (H) 02/12/2017   HGB 10.9 (L) 02/12/2017   HCT 33.9 (L) 02/12/2017   MCV 80.3 02/12/2017    PLT 147 (L) 02/12/2017   CMP Latest Ref Rng & Units 02/11/2017  Glucose 65 - 99 mg/dL 89  BUN 6 - 20 mg/dL 8  Creatinine 0.44 - 1.00 mg/dL 0.61  Sodium 135 - 145 mmol/L 136  Potassium 3.5 - 5.1 mmol/L 3.8  Chloride 101 - 111 mmol/L 106  CO2 22 - 32 mmol/L 21(L)  Calcium 8.9 - 10.3 mg/dL 8.4(L)  Total Protein 6.5 - 8.1 g/dL 6.4(L)  Total Bilirubin 0.3 - 1.2 mg/dL <0.1(L)  Alkaline Phos 38 - 126 U/L 131(H)  AST 15 - 41 U/L 14(L)  ALT 14 - 54 U/L 6(L)    Discharge instruction: per After Visit Summary and "Baby and Me Booklet".  After Visit Meds:  Allergies as of 02/15/2017      Reactions   Chamomile Swelling, Rash      Medication List    STOP taking these medications   ACCU-CHEK AVIVA PLUS w/Device Kit   ACCU-CHEK FASTCLIX LANCETS  Misc   ACCU-CHEK SOFTCLIX LANCETS lancets   glucose blood test strip Commonly known as:  ACCU-CHEK AVIVA PLUS     TAKE these medications   acetaminophen 325 MG tablet Commonly known as:  TYLENOL Take 650 mg by mouth every 6 (six) hours as needed.   COMFORT FIT MATERNITY SUPP SM Misc 1 Units by Does not apply route daily as needed.   docusate sodium 100 MG capsule Commonly known as:  COLACE Take 1 capsule (100 mg total) by mouth 2 (two) times daily.   ibuprofen 600 MG tablet Commonly known as:  ADVIL,MOTRIN Take 1 tablet (600 mg total) by mouth every 6 (six) hours.   multivitamin-prenatal 27-0.8 MG Tabs tablet Take 1 tablet by mouth daily at 12 noon.   promethazine 12.5 MG tablet Commonly known as:  PHENERGAN Take 1 tablet (12.5 mg total) by mouth every 6 (six) hours as needed for nausea or vomiting.            Discharge Care Instructions        Start     Ordered   02/15/17 0000  ibuprofen (ADVIL,MOTRIN) 600 MG tablet  Every 6 hours     02/15/17 0643   02/11/17 0000  OB RESULT CONSOLE Group B Strep    Comments:  This external order was created through the Results Console.   02/11/17 1355   02/11/17 0000  OB RESULTS  CONSOLE GC/Chlamydia    Comments:  This external order was created through the Results Console.   02/11/17 1957      Diet: carb modified diet  Activity: Advance as tolerated. Pelvic rest for 6 weeks.   Outpatient follow up:6 weeks Follow up Appt:Future Appointments Date Time Provider [place]  03/18/2017 9:00 AM Emily Filbert, MD [facility]   Follow up visit: No Follow-up on file.  Postpartum contraception: Nexplanon  Newborn Data: Live born female  Birth Weight: 8 lb 0.2 oz (3634 g) APGAR: 9, 9  Baby Feeding: Breast Disposition:home with mother   02/15/2017 Koren Shiver, CNM

## 2017-02-17 ENCOUNTER — Inpatient Hospital Stay (HOSPITAL_COMMUNITY): Admission: RE | Admit: 2017-02-17 | Payer: Medicaid Other | Source: Ambulatory Visit

## 2017-02-18 ENCOUNTER — Encounter: Payer: Medicaid Other | Admitting: Obstetrics & Gynecology

## 2017-03-18 ENCOUNTER — Encounter: Payer: Self-pay | Admitting: Obstetrics & Gynecology

## 2017-03-18 ENCOUNTER — Ambulatory Visit (INDEPENDENT_AMBULATORY_CARE_PROVIDER_SITE_OTHER): Payer: Medicaid Other | Admitting: Obstetrics & Gynecology

## 2017-03-18 DIAGNOSIS — F53 Postpartum depression: Secondary | ICD-10-CM

## 2017-03-18 DIAGNOSIS — Z30018 Encounter for initial prescription of other contraceptives: Secondary | ICD-10-CM | POA: Diagnosis not present

## 2017-03-18 DIAGNOSIS — O99345 Other mental disorders complicating the puerperium: Secondary | ICD-10-CM

## 2017-03-18 MED ORDER — ETONOGESTREL 68 MG ~~LOC~~ IMPL
68.0000 mg | DRUG_IMPLANT | Freq: Once | SUBCUTANEOUS | Status: AC
  Administered 2017-03-18: 68 mg via SUBCUTANEOUS

## 2017-03-18 NOTE — Progress Notes (Signed)
Subjective:     Patricia Wyatt is a 21 y.o. SW G1 female who presents for a postpartum visit. She is six weeks postpartum following a delivery 02/13/2017. I have fully reviewed the prenatal and intrapartum course. The delivery was at 39 gestational weeks. IOL for [facility] and GDM. Outcome vaginal . Postpartum course has been uncomplicated. Baby's course has been uncomplicated. Baby is feeding by breast. Bleeding none at this time. Bowel function is normal. Bladder function is normal. Patient is not sexually active. Contraception method is nothing at this time, but desires  Nexplanon. Postpartum depression screening: .9, She had a suicide attempt 8/17. She denies HI and SI today. She is willing to see a psychiatrist.  The following portions of the patient's history were reviewed and updated as appropriate: allergies, current medications, past family history, past medical history, past social history, past surgical history and problem list.  Review of Systems Pertinent items are noted in HPI.   Objective:    There were no vitals taken for this visit.  General:  alert   Breasts:  inspection negative, no nipple discharge or bleeding, no masses or nodularity palpable  Lungs: clear to auscultation bilaterally  Heart:  regular rate and rhythm, S1, S2 normal, no murmur, click, rub or gallop  Abdomen: soft, non-tender; bowel sounds normal; no masses,  no organomegaly   Vulva:  not evaluated  Vagina: not evaluated  Cervix:  retroverted  Corpus: not examined  Adnexa:  not evaluated  Rectal Exam: Not performed.         UPT was negative. Consent was signed. Time out procedure was done. Her left arm was prepped with betadine and infiltrated with 3 cc of 1% lidocaine. After adequate anesthesia was assured, the Nexplanon device was placed according to standard of care. Her arm was hemostatic and was bandaged. She tolerated the procedure well.   Assessment:     Normal  postpartum exam. Pap smear not done  at today's visit.   Plan:    1. Contraception: Nexplanon 2. Postpartum Depression- refer to psychiatry 3. Follow up in: 4 weeks for a 2 hour GTT as she had GDM or as needed.

## 2017-03-24 ENCOUNTER — Ambulatory Visit: Payer: Self-pay | Admitting: Family Medicine

## 2017-04-19 ENCOUNTER — Other Ambulatory Visit: Payer: Medicaid Other

## 2017-04-19 DIAGNOSIS — O24419 Gestational diabetes mellitus in pregnancy, unspecified control: Secondary | ICD-10-CM

## 2017-04-20 LAB — GLUCOSE TOLERANCE, 2 HOURS
Glucose, 2 hour: 98 mg/dL (ref 65–139)
Glucose, GTT - Fasting: 75 mg/dL (ref 65–99)

## 2017-11-08 IMAGING — US US MFM OB FOLLOW-UP
1 series · 14 of 28 positions shown · non-contrast
Comparison: none

[Series 1: us mfm ob follow-up · 56 acquisitions, 14 frames shown]
[im 3/56]
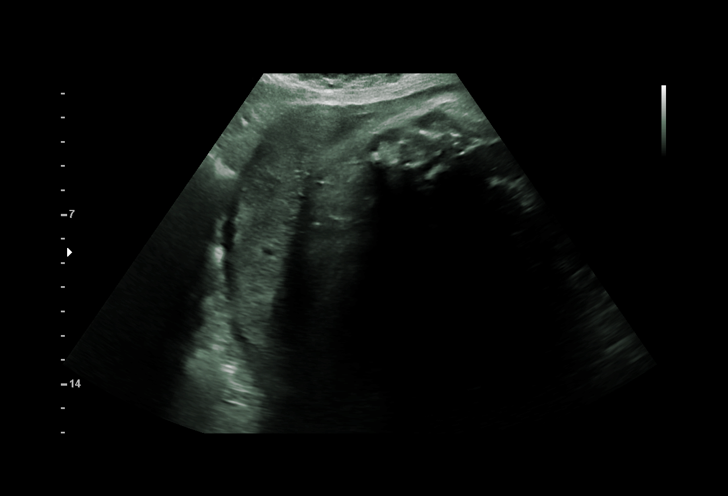
[im 7/56]
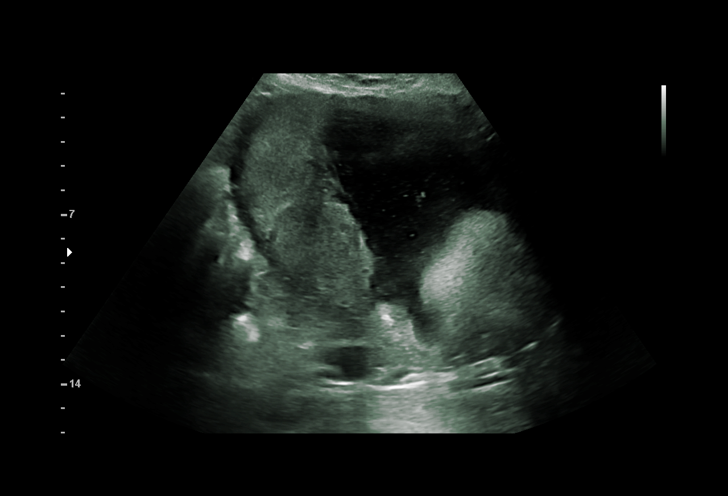
[im 11/56]
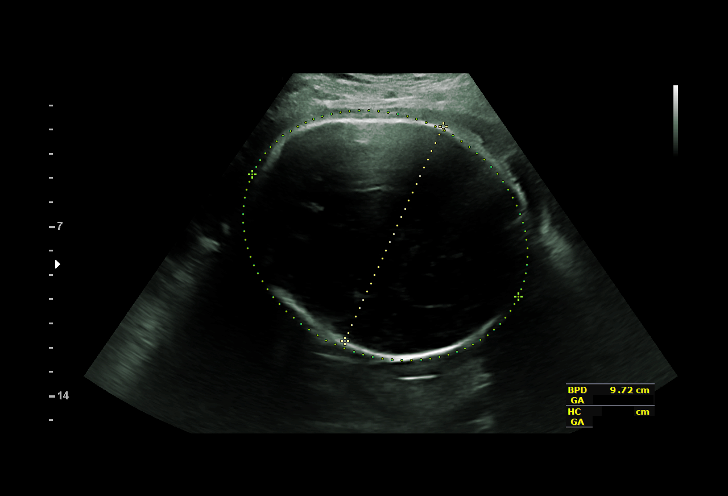
[im 15/56]
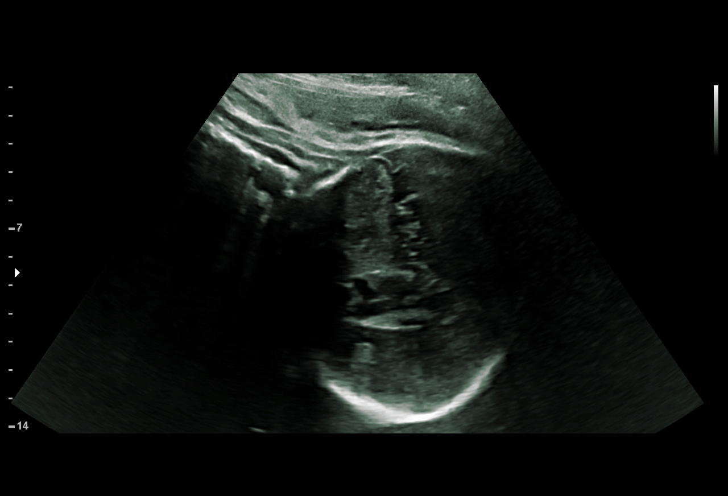
[im 19/56]
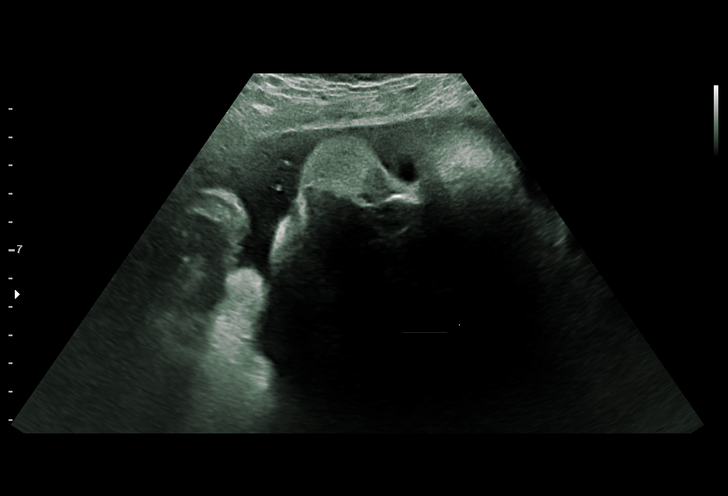
[im 23/56]
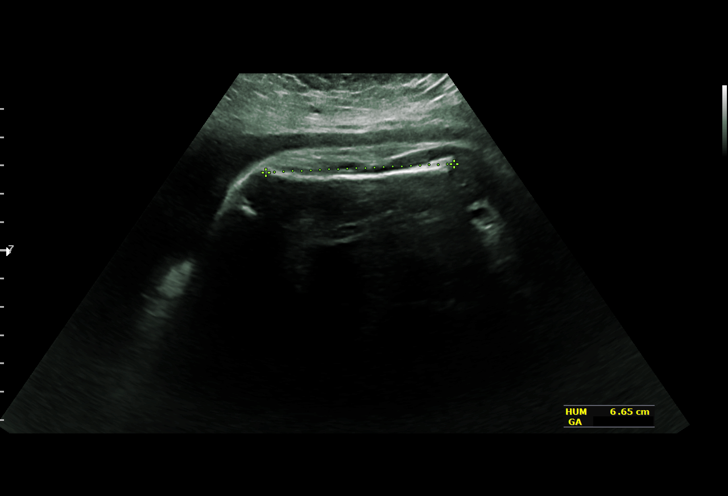
[im 27/56]
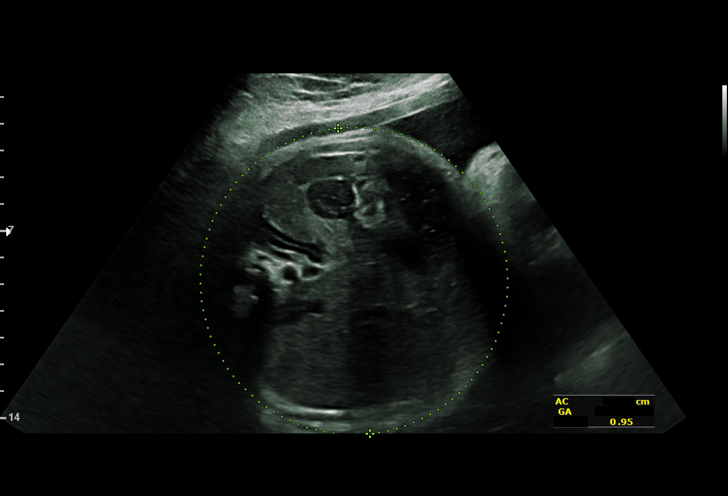
[im 31/56]
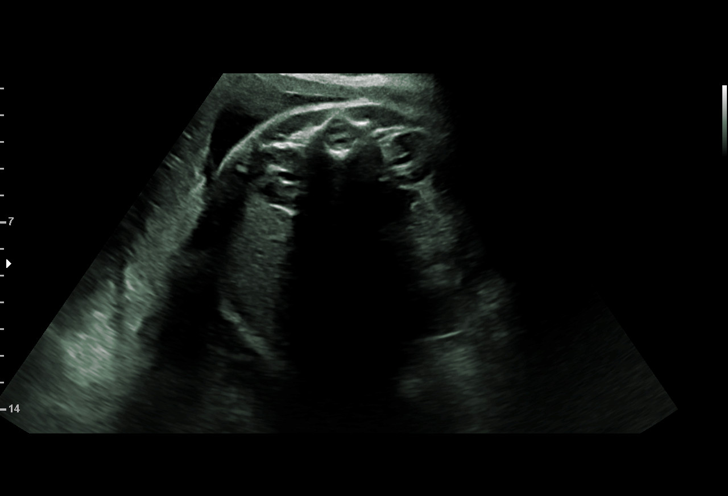
[im 35/56]
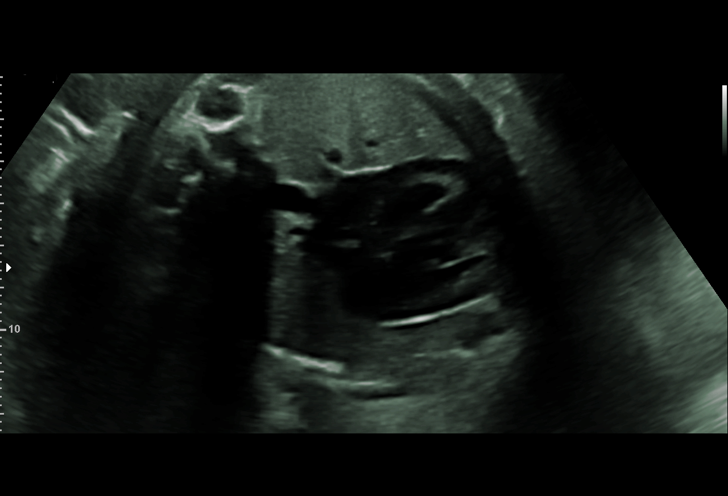
[im 39/56]
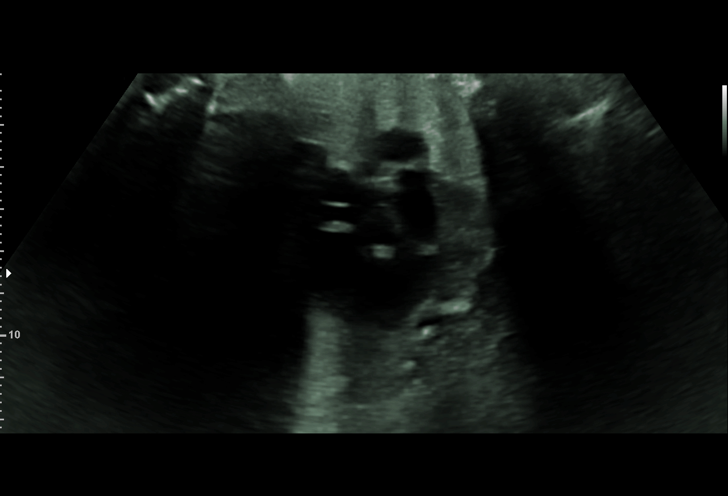
[im 43/56]
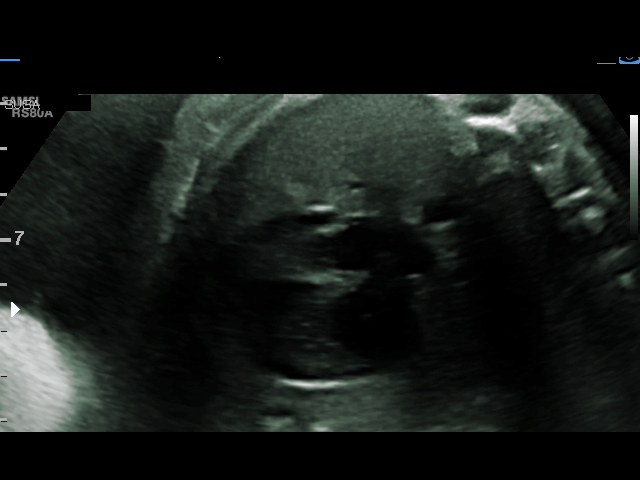
[im 47/56]
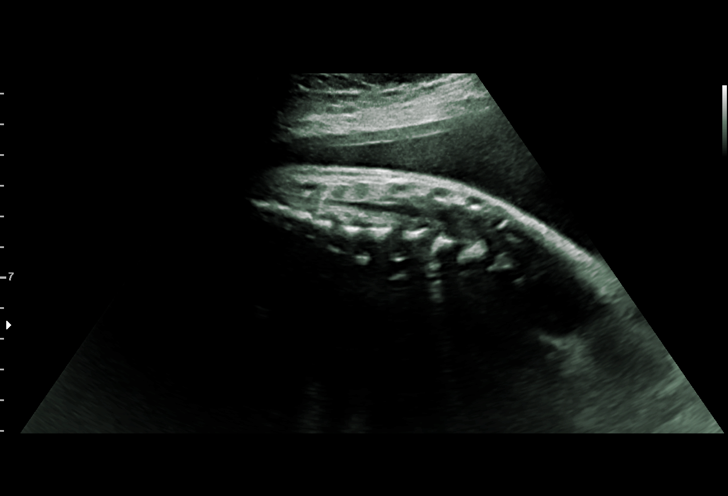
[im 51/56]
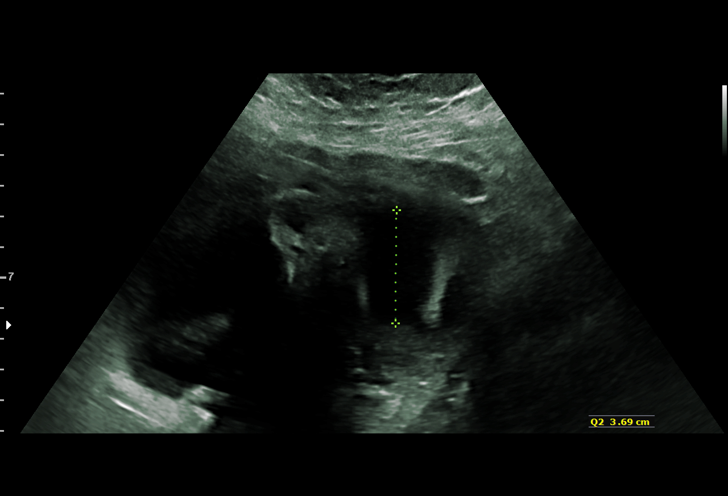
[im 56/56]
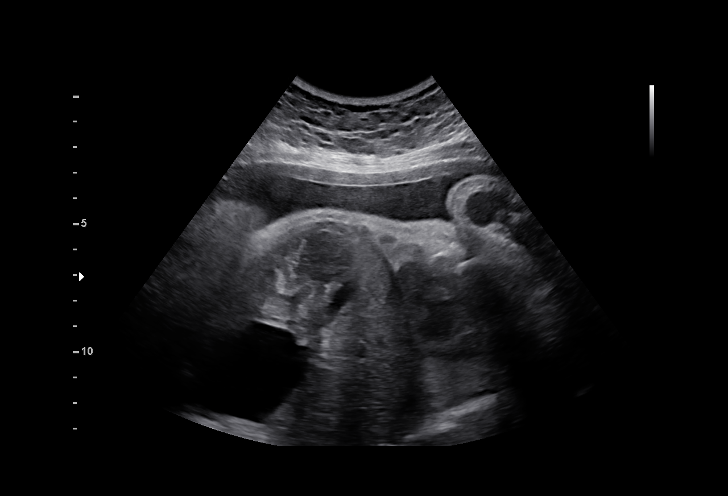

[14 of 28 positions shown; findings below may reference images not displayed]

Attending:        Sacha Almodovar      Address:          [REDACTED]

[address]          [phone]      [phone]     [id number]
Indications

38 weeks gestation of pregnancy
Gestational diabetes in pregnancy, diet
controlled
OB History

Blood Type:            Height:  5'8"   Weight (lb):  201      BMI:
Gravidity:    1         Term:   0        Prem:   0        SAB:   0
TOP:          0       Ectopic:  0        Living: 0
Fetal Evaluation

Num Of Fetuses:     1
Fetal Heart         157
Rate(bpm):
Cardiac Activity:   Observed
Presentation:       Cephalic
Placenta:           Posterior, above cervical os
P. Cord Insertion:  Previously Visualized

Amniotic Fluid
AFI FV:      Subjectively within normal limits

AFI Sum(cm)     %Tile       Largest Pocket(cm)
19.97           78

RUQ(cm)       RLQ(cm)       LUQ(cm)        LLQ(cm)
2.3
Biometry

BPD:      97.4  mm     G. Age:  39w 6d         98  %    CI:        80.58   %   70 - 86
FL/HC:      21.8   %   20.9 -
HC:      342.7  mm     G. Age:  39w 4d         67  %    HC/AC:      0.95       0.92 -
AC:      359.9  mm     G. Age:  39w 6d         97  %    FL/BPD:     76.6   %   71 - 87
FL:       74.6  mm     G. Age:  38w 1d         56  %    FL/AC:      20.7   %   20 - 24
HUM:      66.5  mm     G. Age:  38w 4d         89  %
LV:        7.3  mm

Est. FW:    8134  gm      8 lb 6 oz   > 90  %
Gestational Age

LMP:           38w 0d       Date:   05/13/16                 EDD:   02/17/17
U/S Today:     39w 3d                                        EDD:   02/07/17
Best:          38w 0d    Det. By:   LMP  (05/13/16)          EDD:   02/17/17
Anatomy

Cranium:               Appears normal         Aortic Arch:            Previously seen
Cavum:                 Appears normal         Ductal Arch:            Previously seen
Ventricles:            Appears normal         Diaphragm:              Appears normal
Choroid Plexus:        Appears normal         Stomach:                Appears normal, left
sided
Cerebellum:            Appears normal         Abdomen:                Appears normal
Posterior Fossa:       Appears normal         Abdominal Wall:         Previously seen
Nuchal Fold:           Previously seen        Cord Vessels:           Appears normal (3
vessel cord)
Face:                  Appears normal         Kidneys:                Appear normal
(orbits and profile)
Lips:                  Appears normal         Bladder:                Appears normal
Thoracic:              Appears normal         Spine:                  Appears normal
Heart:                 Appears normal         Upper Extremities:      Previously seen
(4CH, axis, and situs
RVOT:                  Appears normal         Lower Extremities:      Previously seen
LVOT:                  Appears normal

Other:  Fetus appears to be a female. Heels and Right 5th digit prev.
visualized.
Cervix Uterus Adnexa

Cervix
Not visualized (advanced GA >45wks)

Left Ovary
Previously seen.

Right Ovary
Previously seen
Impression

Single living intrauterine pregnancy at 38 weeks 0 days.
Greater than expected fetal growth (EFW>90%).
Normal amniotic fluid volume.
Normal interval fetal anatomy.
Recommendations

Follow-up ultrasounds as clinically indicated.

## 2019-04-26 ENCOUNTER — Ambulatory Visit: Payer: Medicaid Other | Admitting: Advanced Practice Midwife

## 2019-04-26 ENCOUNTER — Encounter: Payer: Self-pay | Admitting: Advanced Practice Midwife

## 2019-04-26 ENCOUNTER — Other Ambulatory Visit: Payer: Self-pay

## 2019-04-26 VITALS — BP 128/83 | HR 80 | Ht 67.0 in | Wt 232.0 lb

## 2019-04-26 DIAGNOSIS — Z3046 Encounter for surveillance of implantable subdermal contraceptive: Secondary | ICD-10-CM

## 2019-04-26 DIAGNOSIS — Z3009 Encounter for other general counseling and advice on contraception: Secondary | ICD-10-CM

## 2019-04-26 MED ORDER — NORGESTIMATE-ETH ESTRADIOL 0.25-35 MG-MCG PO TABS: 1.0000 | ORAL_TABLET | Freq: Every day | ORAL | 2 refills | Status: DC

## 2019-04-26 NOTE — Patient Instructions (Signed)

## 2019-04-26 NOTE — Progress Notes (Signed)
     GYNECOLOGY OFFICE PROCEDURE NOTE  Patricia Wyatt is a 23 y.o. G1P1001 here for Nexplanon removal.  Last pap smear was on 08/20/2016 and was normal.  No other gynecologic concerns.  Nexplanon Removal Patient identified, informed consent performed, consent signed.   Appropriate time out taken. Nexplanon site identified.  Area prepped in usual sterile fashon. One ml of 1% lidocaine was used to anesthetize the area at the distal end of the implant. A small stab incision was made right beside the implant on the distal portion.  The Nexplanon rod was grasped using hemostats and removed without difficulty.  There was minimal blood loss. There were no complications.  3 ml of 1% lidocaine was injected around the incision for post-procedure analgesia.  Steri-strips were applied over the small incision.  A pressure bandage was applied to reduce any bruising.  The patient tolerated the procedure well and was given post procedure instructions.  Patient is planning to use OCPs for contraception   Given short course OCPs. Patient to report to clinic if OCP worsens her headaches.  Mallie Snooks, MSN, CNM Certified Nurse Midwife, [facility] for [employer], [place] 04/26/19 6:35 PM

## 2019-04-26 NOTE — Progress Notes (Signed)
Would like to get nexplanon removed and discuss depo or OCP's

## 2019-07-06 ENCOUNTER — Encounter: Payer: Self-pay | Admitting: Obstetrics and Gynecology

## 2019-07-06 ENCOUNTER — Ambulatory Visit: Payer: Medicaid Other | Admitting: Obstetrics and Gynecology

## 2019-07-07 NOTE — Progress Notes (Signed)
Patient did not keep her GYN appointment for 07/06/2019.  Cornelia Copa MD Attending Center for [employer] Midwife)

## 2019-07-28 ENCOUNTER — Other Ambulatory Visit: Payer: Self-pay | Admitting: *Deleted

## 2019-07-28 MED ORDER — NORGESTIMATE-ETH ESTRADIOL 0.25-35 MG-MCG PO TABS: 1.0000 | ORAL_TABLET | Freq: Every day | ORAL | 2 refills | Status: AC

## 2020-06-19 ENCOUNTER — Other Ambulatory Visit: Payer: Self-pay

## 2020-06-19 ENCOUNTER — Emergency Department (INDEPENDENT_AMBULATORY_CARE_PROVIDER_SITE_OTHER)
Admission: RE | Admit: 2020-06-19 | Discharge: 2020-06-19 | Disposition: A | Discharge status: Home / Self Care | Payer: Medicaid Other | Source: Ambulatory Visit

## 2020-06-19 VITALS — BP 149/85 | HR 77 | Temp 98.3°F | Resp 18 | Ht 67.0 in | Wt 190.0 lb

## 2020-06-19 DIAGNOSIS — Z20822 Contact with and (suspected) exposure to covid-19: Secondary | ICD-10-CM | POA: Diagnosis not present

## 2020-06-19 DIAGNOSIS — J069 Acute upper respiratory infection, unspecified: Secondary | ICD-10-CM

## 2020-06-19 DIAGNOSIS — R519 Headache, unspecified: Secondary | ICD-10-CM

## 2020-06-19 HISTORY — DX: Anxiety disorder, unspecified: F41.9

## 2020-06-19 HISTORY — DX: Depression, unspecified: F32.A

## 2020-06-19 HISTORY — DX: Migraine, unspecified, not intractable, without status migrainosus: G43.909

## 2020-06-19 MED ORDER — PREDNISONE 20 MG PO TABS: 20.0000 mg | ORAL_TABLET | Freq: Every day | ORAL | 0 refills | Status: AC

## 2020-06-19 NOTE — ED Triage Notes (Signed)
Pt presents to Urgent Care with c/o sore throat, cough, nasal congestion, and headache/"migraine" x 3 days. Pt w/ known COVID exposure; has not been vaccinated.

## 2020-06-19 NOTE — Discharge Instructions (Addendum)
Your COVID 19 results will be available in 3-5 days. Negative results are immediately resulted to Mychart. Positive results will receive a follow-up call from our clinic. If symptoms are present, I recommend home quarantine until results are known.  °

## 2020-06-19 NOTE — ED Provider Notes (Signed)
[facility]    CSN: [id number] Arrival date & time: 06/19/20  0858      History   Chief Complaint Chief Complaint  Patient presents with  . Sore Throat  . Cough  . Headache    "Migraine"    HPI Patricia Wyatt is a 25 y.o. female.   HPI  Patient presents with URI symptoms including cough, headache, and sinus pressure. Known of COVID exposure.  COVID Vaccinated: N. Flu vaccinated: N Denies worrisome symptoms of shortness of breath, weakness, N&V, and chest pain. Past Medical History:  Diagnosis Date  . Anxiety   . Depression   . Gestational diabetes   . Mental disorder   . Migraines     Patient Active Problem List   Diagnosis Date Noted  . LGA (large for gestational age) fetus affecting management of mother 02/11/2017  . Gestational hypertension 02/11/2017  . GDM (gestational diabetes mellitus) 12/06/2016  . Supervision of high risk pregnancy, antepartum, third trimester 08/20/2016  . Obesity in pregnancy 08/20/2016    Past Surgical History:  Procedure Laterality Date  . NO PAST SURGERIES      OB History    Gravida  1   Para  1   Term  1   Preterm      AB      Living  1     SAB      IAB      Ectopic      Multiple  0   Live Births  1            Home Medications    Prior to Admission medications   Medication Sig Start Date End Date Taking? Authorizing Provider  buPROPion (ZYBAN) 150 MG 12 hr tablet Take 150 mg by mouth 2 (two) times daily.   Yes [provider]  topiramate (TOPAMAX) 100 MG tablet Take 100 mg by mouth daily.   Yes [provider]  acetaminophen (TYLENOL) 325 MG tablet Take 650 mg by mouth every 6 (six) hours as needed.    [provider]  docusate sodium (COLACE) 100 MG capsule Take 1 capsule (100 mg total) by mouth 2 (two) times daily. Patient not taking: No sig reported 01/09/17   Judeth Horn, NP  Elastic Bandages & Supports (COMFORT FIT MATERNITY SUPP SM) MISC 1 Units by Does  not apply route daily as needed. Patient not taking: No sig reported 10/08/16   Judeth Horn, NP  escitalopram (LEXAPRO) 10 MG tablet Take 10 mg by mouth daily.    [provider]  ibuprofen (ADVIL,MOTRIN) 600 MG tablet Take 1 tablet (600 mg total) by mouth every 6 (six) hours. Patient not taking: No sig reported 02/15/17   Montez Morita, CNM  norgestimate-ethinyl estradiol (ORTHO-CYCLEN) 0.25-35 MG-MCG tablet Take 1 tablet by mouth daily. 07/28/19   Calvert Cantor, CNM  Prenatal Vit-Fe Fumarate-FA (MULTIVITAMIN-PRENATAL) 27-0.8 MG TABS tablet Take 1 tablet by mouth daily at 12 noon.    [provider]  promethazine (PHENERGAN) 12.5 MG tablet Take 1 tablet (12.5 mg total) by mouth every 6 (six) hours as needed for nausea or vomiting. Patient not taking: No sig reported 08/28/16   Lorne Skeens, MD    Family History Family History  Problem Relation Age of Onset  . Lung cancer Maternal Grandfather   . Healthy Mother     Social History Social History   Tobacco Use  . Smoking status: Former Smoker    Types: Cigarettes  Quit date: 07/16/2016    Years since quitting: 3.9  . Smokeless tobacco: Never Used  . Tobacco comment: Stopped smoking when found out about pregnancy  Vaping Use  . Vaping Use: Never used  Substance Use Topics  . Alcohol use: Yes    Comment: rarely  . Drug use: Yes    Frequency: 7.0 times per week    Types: Marijuana     Allergies   Chamomile   Review of Systems Review of Systems Pertinent negatives listed in HPI Physical Exam Triage Vital Signs ED Triage Vitals  Enc Vitals Group     BP 06/19/20 0929 (!) 149/85     Pulse Rate 06/19/20 0929 77     Resp 06/19/20 0929 18     Temp 06/19/20 0929 98.3 F (36.8 C)     Temp Source 06/19/20 0929 Oral     SpO2 06/19/20 0929 99 %     Weight 06/19/20 0923 190 lb (86.2 kg)     Height 06/19/20 0923 5\' 7"  (1.702 m)     Head Circumference --      Peak Flow --      Pain Score  06/19/20 0923 6     Pain Loc --      Pain Edu? --      Excl. in GC? --    No data found.  Updated Vital Signs BP (!) 149/85 (BP Location: Right Arm)   Pulse 77   Temp 98.3 F (36.8 C) (Oral)   Resp 18   Ht 5\' 7"  (1.702 m)   Wt 190 lb (86.2 kg)   LMP 06/01/2020   SpO2 99%   BMI 29.76 kg/m   Visual Acuity Right Eye Distance:   Left Eye Distance:   Bilateral Distance:    Right Eye Near:   Left Eye Near:    Bilateral Near:     Physical Exam  General Appearance:    Alert, cooperative, acutely ill appearing,  no distress  HENT:   Normocephalic, ears normal, congestion, rhinorrhea, oropharynx    Eyes:    PERRL, conjunctiva/corneas clear, EOM's intact       Lungs:     Clear to auscultation bilaterally, respirations unlabored  Heart:    Regular rate and rhythm  Neurologic:   Awake, alert, oriented x 3. No apparent focal neurological           defect.      UC Treatments / Results  Labs (all labs ordered are listed, but only abnormal results are displayed) Labs Reviewed - No data to display  EKG   Radiology No results found.  Procedures Procedures (including critical care time)  Medications Ordered in UC Medications - No data to display  Initial Impression / Assessment and Plan / UC Course  I have reviewed the triage vital signs and the nursing notes.  Pertinent labs & imaging results that were available during my care of the patient were reviewed by me and considered in my medical decision making (see chart for details).    COVID/Flu test pending. Symptom management warranted only.  Manage fever with Tylenol and ibuprofen.  Nasal symptoms with over-the-counter antihistamines recommended.  Treatment per discharge medications/discharge instructions.  Red flags/ER precautions given. The most current CDC isolation/quarantine recommendation advised.  Final Clinical Impressions(s) / UC Diagnoses   Final diagnoses:  Exposure to COVID-19 virus  Viral URI with cough   Intractable episodic headache, unspecified headache type     Discharge Instructions  Your COVID 19 results will be available in 3-5 days.. Negative results are immediately resulted to Mychart. Positive results will receive a follow-up call from our clinic. If symptoms are present, I recommend home quarantine until results are known.     ED Prescriptions    Medication Sig Dispense Auth. Provider   predniSONE (DELTASONE) 20 MG tablet Take 1 tablet (20 mg total) by mouth daily with breakfast. 5 tablet Scot Jun, FNP     PDMP not reviewed this encounter.   Scot Jun, FNP 06/26/20 1108

## 2020-06-21 LAB — COVID-19, FLU A+B NAA
Influenza A, NAA: NOT DETECTED
Influenza B, NAA: NOT DETECTED
SARS-CoV-2, NAA: DETECTED — AB

## 2020-07-04 ENCOUNTER — Ambulatory Visit: Payer: Medicaid Other | Admitting: Family Medicine

## 2020-08-12 ENCOUNTER — Emergency Department (HOSPITAL_COMMUNITY)
Admission: EM | Admit: 2020-08-12 | Discharge: 2020-08-12 | Disposition: A | Discharge status: Home / Self Care | Payer: Medicaid Other | Attending: Emergency Medicine | Admitting: Emergency Medicine

## 2020-08-12 ENCOUNTER — Encounter (HOSPITAL_COMMUNITY): Payer: Self-pay | Admitting: Emergency Medicine

## 2020-08-12 ENCOUNTER — Other Ambulatory Visit: Payer: Self-pay

## 2020-08-12 ENCOUNTER — Emergency Department (HOSPITAL_COMMUNITY): Payer: Medicaid Other

## 2020-08-12 DIAGNOSIS — J029 Acute pharyngitis, unspecified: Secondary | ICD-10-CM

## 2020-08-12 DIAGNOSIS — R0602 Shortness of breath: Secondary | ICD-10-CM | POA: Diagnosis present

## 2020-08-12 DIAGNOSIS — Z87891 Personal history of nicotine dependence: Secondary | ICD-10-CM | POA: Diagnosis not present

## 2020-08-12 LAB — CBC
HCT: 37 % (ref 36.0–46.0)
Hemoglobin: 12.1 g/dL (ref 12.0–15.0)
MCH: 29.1 pg (ref 26.0–34.0)
MCHC: 32.7 g/dL (ref 30.0–36.0)
MCV: 88.9 fL (ref 80.0–100.0)
Platelets: 204 10*3/uL (ref 150–400)
RBC: 4.16 MIL/uL (ref 3.87–5.11)
RDW: 12.5 % (ref 11.5–15.5)
WBC: 9.9 10*3/uL (ref 4.0–10.5)
nRBC: 0 % (ref 0.0–0.2)

## 2020-08-12 LAB — BASIC METABOLIC PANEL
Anion gap: 7 (ref 5–15)
BUN: 12 mg/dL (ref 6–20)
CO2: 25 mmol/L (ref 22–32)
Calcium: 9.2 mg/dL (ref 8.9–10.3)
Chloride: 105 mmol/L (ref 98–111)
Creatinine, Ser: 0.75 mg/dL (ref 0.44–1.00)
GFR, Estimated: >60 mL/min (ref >60)
Glucose, Bld: 91 mg/dL (ref 70–99)
Potassium: 4 mmol/L (ref 3.5–5.1)
Sodium: 137 mmol/L (ref 135–145)

## 2020-08-12 LAB — I-STAT BETA HCG BLOOD, ED (MC, WL, AP ONLY): I-stat hCG, quantitative: <5 m[IU]/mL (ref <5)

## 2020-08-12 LAB — GROUP A STREP BY PCR: Group A Strep by PCR: NOT DETECTED

## 2020-08-12 MED ORDER — RACEPINEPHRINE HCL 2.25 % IN NEBU
0.5000 mL | INHALATION_SOLUTION | Freq: Once | RESPIRATORY_TRACT | Status: AC
  Administered 2020-08-12: 0.5 mL via RESPIRATORY_TRACT
  Filled 2020-08-12: qty 0.5

## 2020-08-12 MED ORDER — IOHEXOL 300 MG/ML  SOLN
75.0000 mL | Freq: Once | INTRAMUSCULAR | Status: AC | PRN
  Administered 2020-08-12: 75 mL via INTRAVENOUS

## 2020-08-12 MED ORDER — DEXAMETHASONE SODIUM PHOSPHATE 10 MG/ML IJ SOLN
10.0000 mg | Freq: Once | INTRAMUSCULAR | Status: AC
  Administered 2020-08-12: 10 mg via INTRAVENOUS
  Filled 2020-08-12: qty 1

## 2020-08-12 NOTE — ED Provider Notes (Signed)
[facility] Provider Note   CSN: [id number] Arrival date & time: 08/12/20  1038     History Chief Complaint  Patient presents with  . Sore Throat  . Shortness of Breath    Patricia NorfolkXena Wyatt is a 25 y.o. female with PMHx anxiety and depression who presents to the ED today with significant other with complaint of gradual onset, constant, sharp/achy, sore throat that began this morning upon waking up. Pt also complains of shortness of breath with inhaling. Significant other is with pt and states that she is making a funny noise when breathing. She is still able to drink and tolerate her own secretions. She has not taken anything for pain. Denies any allergies or recent new foods, medicines, detergents, hygiene products, etc. Denies fevers, chills, or any other associated symptoms. No history of recurrent strep throat.   The history is provided by the patient, a significant other and medical records.       Past Medical History:  Diagnosis Date  . Anxiety   . Depression   . Gestational diabetes   . Mental disorder   . Migraines     Patient Active Problem List   Diagnosis Date Noted  . LGA (large for gestational age) fetus affecting management of mother 02/11/2017  . Gestational hypertension 02/11/2017  . GDM (gestational diabetes mellitus) 12/06/2016  . Supervision of high risk pregnancy, antepartum, third trimester 08/20/2016  . Obesity in pregnancy 08/20/2016    Past Surgical History:  Procedure Laterality Date  . NO PAST SURGERIES       OB History    Gravida  1   Para  1   Term  1   Preterm      AB      Living  1     SAB      IAB      Ectopic      Multiple  0   Live Births  1           Family History  Problem Relation Age of Onset  . Lung cancer Maternal Grandfather   . Healthy Mother     Social History   Tobacco Use  . Smoking status: Former Smoker    Types: Cigarettes    Quit date: 07/16/2016    Years  since quitting: 4.0  . Smokeless tobacco: Never Used  . Tobacco comment: Stopped smoking when found out about pregnancy  Vaping Use  . Vaping Use: Never used  Substance Use Topics  . Alcohol use: Yes    Comment: rarely  . Drug use: Yes    Frequency: 7.0 times per week    Types: Marijuana    Home Medications Prior to Admission medications   Medication Sig Start Date End Date Taking? Authorizing Provider  acetaminophen (TYLENOL) 325 MG tablet Take 650 mg by mouth every 6 (six) hours as needed.    [provider]  buPROPion (ZYBAN) 150 MG 12 hr tablet Take 150 mg by mouth 2 (two) times daily.    [provider]  docusate sodium (COLACE) 100 MG capsule Take 1 capsule (100 mg total) by mouth 2 (two) times daily. Patient not taking: No sig reported 01/09/17   Judeth HornLawrence, Erin, NP  Elastic Bandages & Supports (COMFORT FIT MATERNITY SUPP SM) MISC 1 Units by Does not apply route daily as needed. Patient not taking: No sig reported 10/08/16   Judeth HornLawrence, Erin, NP  escitalopram (LEXAPRO) 10 MG tablet Take 10 mg by  mouth daily.    [provider]  ibuprofen (ADVIL,MOTRIN) 600 MG tablet Take 1 tablet (600 mg total) by mouth every 6 (six) hours. Patient not taking: No sig reported 02/15/17   Montez Morita, CNM  norgestimate-ethinyl estradiol (ORTHO-CYCLEN) 0.25-35 MG-MCG tablet Take 1 tablet by mouth daily. 07/28/19   Calvert Cantor, CNM  predniSONE (DELTASONE) 20 MG tablet Take 1 tablet (20 mg total) by mouth daily with breakfast. 06/19/20   Bing Neighbors, FNP  Prenatal Vit-Fe Fumarate-FA (MULTIVITAMIN-PRENATAL) 27-0.8 MG TABS tablet Take 1 tablet by mouth daily at 12 noon.    [provider]  promethazine (PHENERGAN) 12.5 MG tablet Take 1 tablet (12.5 mg total) by mouth every 6 (six) hours as needed for nausea or vomiting. Patient not taking: No sig reported 08/28/16   Lorne Skeens, MD  topiramate (TOPAMAX) 100 MG tablet Take 100 mg by mouth daily.     [provider]    Allergies    Chamomile  Review of Systems   Review of Systems  Constitutional: Negative for chills and fever.  HENT: Positive for sore throat and voice change. Negative for drooling.   Respiratory: Positive for shortness of breath.   All other systems reviewed and are negative.   Physical Exam Updated Vital Signs BP (!) 141/92 (BP Location: Left Arm)   Pulse 75   Temp 98.5 F (36.9 C) (Oral)   Resp 18   SpO2 98%   Physical Exam Vitals and nursing note reviewed.  Constitutional:      Appearance: She is not ill-appearing or diaphoretic.  HENT:     Head: Normocephalic and atraumatic.     Left Ear: Tympanic membrane is not erythematous.     Mouth/Throat:     Mouth: Mucous membranes are moist.     Pharynx: Uvula midline. Posterior oropharyngeal erythema (mild) present. No oropharyngeal exudate or uvula swelling.  Eyes:     Conjunctiva/sclera: Conjunctivae normal.  Cardiovascular:     Rate and Rhythm: Normal rate and regular rhythm.  Pulmonary:     Breath sounds: Stridor present. No wheezing, rhonchi or rales.     Comments: Stridor noted when pt breathing through her nose; slightly decreased when breathing through her mouth. Voice does sound slightly muffled however able to speak in full sentences. Satting 100% on RA.  Abdominal:     Palpations: Abdomen is soft.     Tenderness: There is no abdominal tenderness.  Musculoskeletal:     Cervical back: Neck supple.  Skin:    General: Skin is warm and dry.  Neurological:     Mental Status: She is alert.     ED Results / Procedures / Treatments   Labs (all labs ordered are listed, but only abnormal results are displayed) Labs Reviewed  GROUP A STREP BY PCR  CBC  BASIC METABOLIC PANEL  I-STAT BETA HCG BLOOD, ED ([facility], [facility], [facility] ONLY)    EKG EKG Interpretation  Date/Time:  Monday August 12 2020 10:50:15 EST Ventricular Rate:  72 PR Interval:  174 QRS Duration: 82 QT Interval:  370 QTC  Calculation: 405 R Axis:   68 Text Interpretation: Normal sinus rhythm with sinus arrhythmia Normal ECG Confirmed by Blane Ohara [phone]) on 08/12/2020 12:32:04 PM   Radiology DG Neck Soft Tissue  Result Date: 08/12/2020 CLINICAL DATA:  Sore throat. EXAM: NECK SOFT TISSUES - 1+ VIEW COMPARISON:  None. FINDINGS: Two soft tissue views of the neck. Prevertebral soft tissues are normal. No epiglottic swelling.  Lung apices are clear. IMPRESSION: No acute findings. Electronically Signed   By: Jeronimo Greaves M.D.   On: 08/12/2020 12:20   DG Chest 1 View  Result Date: 08/12/2020 CLINICAL DATA:  Wheezing and shortness of breath. EXAM: CHEST  1 VIEW COMPARISON:  None. FINDINGS: The heart size and mediastinal contours are within normal limits. There is no evidence of pulmonary edema, consolidation, pneumothorax or pleural fluid. The visualized skeletal structures are unremarkable. IMPRESSION: No active disease. Electronically Signed   By: Irish Lack M.D.   On: 08/12/2020 11:21   CT Soft Tissue Neck W Contrast  Result Date: 08/12/2020 CLINICAL DATA:  Sore throat, stridor EXAM: CT NECK WITH CONTRAST TECHNIQUE: Multidetector CT imaging of the neck was performed using the standard protocol following the bolus administration of intravenous contrast. CONTRAST:  69mL OMNIPAQUE IOHEXOL 300 MG/ML  SOLN COMPARISON:  None. FINDINGS: Pharynx and larynx: Unremarkable.  No mass or swelling. Salivary glands: Unremarkable. Thyroid: Normal. Lymph nodes: No enlarged or abnormal density nodes. Vascular: Major neck vessels are patent. Limited intracranial: No abnormal enhancement. Visualized orbits: Unremarkable. Mastoids and visualized paranasal sinuses: No significant opacification. Skeleton: No significant osseous abnormality. Upper chest: Negative. Other: None. IMPRESSION: No significant inflammatory changes identified. Electronically Signed   By: Guadlupe Spanish M.D.   On: 08/12/2020 15:00    Procedures Procedures    Medications Ordered in ED Medications  dexamethasone (DECADRON) injection 10 mg (10 mg Intravenous Given 08/12/20 1227)  Racepinephrine HCl 2.25 % nebulizer solution 0.5 mL (0.5 mLs Nebulization Given 08/12/20 1233)  iohexol (OMNIPAQUE) 300 MG/ML solution 75 mL (75 mLs Intravenous Contrast Given 08/12/20 1432)    ED Course  I have reviewed the triage vital signs and the nursing notes.  Pertinent labs & imaging results that were available during my care of the patient were reviewed by me and considered in my medical decision making (see chart for details).    MDM Rules/Calculators/A&P                          25 year old female who presents to the ED today complaining of sore throat that began while she woke up this morning with difficulty breathing as well.  To the ED vitals are stable.  Patient is afebrile, nontachycardic and nontachypneic.  She was initially seen in the waiting room and lab work has been drawn including CBC, BMP, chest x-ray.  On exam patient first has her boyfriend do all the talking, states that she is unable to talk due to severe pain however she is not able to speak in full sentences without difficulty.  Her voice does appear slightly muffled at this time.  She is noted to have appears to be stridor.  When she is breathing through her nose she is making a whistling type sound with her throat, when she breathes through her mouth this is still present however slightly diminished.  Throat does appear mildly erythematous however her uvula is midline, no drooling, no tripoding to suggest epiglottitis.  Attending physician Dr. Jodi Mourning has evaluated patient as well; recommend starting with a x-ray of the neck, Decadron, racemic epinephrine.   Chest x-ray that was obtained while in the waiting room was negative at this time.  CBC and BMP also reassuring, strep test negative.  Will swab for strep at this time.  It does appear that patient had Covid last month; states she feels like  she cannot breathe due to her sore throat, low suspicion  for PE at this time.  Is not hypoxic and not tachycardic.   X-ray of the neck without any acute findings.  On reevaluation patient appears more comfortable after Decadron and racemic epinephrine.  However given initial presentation with complaint of sore throat and difficulty breathing we will plan for CT soft tissue neck for further evaluation with concern for possible deep space infection.  Strep test negative.   CT scan without acute abnormalities at this time. On reevaluation pt is cuddling with her significant other in the stretcher. She is breathing normally at this time without any signs of stridor or other respiratory distress. Will plan to discharge home at this time; suspect viral pharyngitis vs anxiety causing pt's symptoms today. Have encouraged Ibuprofen and Tylenol PRN for pain with increase in fluids to stay hydrated and cough drops. Pt has a PCP appointment tomorrow; advised to keep for reeval and to return for any worsening symptoms. Pt is in agreement with plan and stable for discharge home.   This note was prepared using Dragon voice recognition software and may include unintentional dictation errors due to the inherent limitations of voice recognition software.  Final Clinical Impression(s) / ED Diagnoses Final diagnoses:  Viral pharyngitis    Rx / DC Orders ED Discharge Orders    None       Discharge Instructions     Your workup was reassuring at this time. Your strep test was negative and your CT scan did not show any blockage in you throat. Your sore throat is likely from a virus. Please take Ibuprofen and Tylenol as needed for pain. Drink plenty of fluids to stay hydrated. Use cough drops as needed.   Follow up with your PCP as scheduled for tomorrow for reevaluation   Return to the ED for any worsening symptoms       Tanda Rockers, PA-C 08/12/20 1518    Blane Ohara, MD 08/13/20 [phone]

## 2020-08-12 NOTE — Discharge Instructions (Addendum)
Your workup was reassuring at this time. Your strep test was negative and your CT scan did not show any blockage in you throat. Your sore throat is likely from a virus. Please take Ibuprofen and Tylenol as needed for pain. Drink plenty of fluids to stay hydrated. Use cough drops as needed.   Follow up with your PCP as scheduled for tomorrow for reevaluation   Return to the ED for any worsening symptoms

## 2020-08-12 NOTE — ED Triage Notes (Signed)
Patient coming from home. Complaint of sore throat that began when she woke up this morning. Denies any other symptoms. NAD.

## 2020-08-12 NOTE — ED Notes (Signed)
Patient transported to X-ray 

## 2020-08-12 NOTE — ED Notes (Signed)
PT refused vital signs

## 2022-04-23 ENCOUNTER — Encounter: Payer: Self-pay | Admitting: Obstetrics & Gynecology

## 2022-04-23 ENCOUNTER — Ambulatory Visit: Payer: Medicaid Other | Admitting: Obstetrics & Gynecology

## 2022-04-23 VITALS — BP 140/84 | HR 65 | Temp 98.6°F | Wt 203.0 lb

## 2022-04-23 DIAGNOSIS — N764 Abscess of vulva: Secondary | ICD-10-CM

## 2022-04-23 NOTE — Progress Notes (Signed)
   GYNECOLOGY OFFICE VISIT NOTE  History:   Patricia Wyatt is a 26 y.o. G1P1001 here today for management of left vulvar abscess.  She noticed this 7 days ago and it got bigger and more painful. Was seen at [facility] on 04/18/22 and she was told to come to GYN for management given concern about possible Bartholin's gland abscess.  Patient was given a course of Bactrim which she is still taking and pain medications.  She reported that the cyst ruptured on its own yesterday 04/22/22, and she feels a lot better. She denies any abnormal vaginal discharge, bleeding, pelvic pain or other concerns.    Past Medical History:  Diagnosis Date   Anxiety    Depression    Gestational diabetes    Mental disorder    Migraines     Past Surgical History:  Procedure Laterality Date   NO PAST SURGERIES      The following portions of the patient's history were reviewed and updated as appropriate: allergies, current medications, past family history, past medical history, past social history, past surgical history and problem list.   Health Maintenance:  Normal pap in 10/2021 at [facility].   Review of Systems:  Pertinent items noted in HPI and remainder of comprehensive ROS otherwise negative.  Physical Exam:  BP (!) 140/84   Pulse 65   Temp 98.6 F (37 C)   Wt 203 lb (92.1 kg)   BMI 31.79 kg/m  CONSTITUTIONAL: Well-developed, well-nourished female in no acute distress.  SKIN: No rash noted. Not diaphoretic. No erythema. No pallor. MUSCULOSKELETAL: Normal range of motion. No edema noted. NEUROLOGIC: Alert and oriented to person, place, and time. Normal muscle tone coordination. No cranial nerve deficit noted. PSYCHIATRIC: Normal mood and affect. Normal behavior. Normal judgment and thought content. CARDIOVASCULAR: Normal heart rate noted RESPIRATORY: Effort and breath sounds normal, no problems with respiration noted ABDOMEN: No masses noted. No other overt distention noted.   PELVIC:  Normal appearing external genitalia with healing 1 cm abscess on inferior, outer aspect of left labium majus. Not associated with Bartholin's gland. Scant bloody drainage noted, nontender. No erythema. Normal urethral meatus; normal appearing distal vaginal mucosa.  No abnormal discharge noted.  Performed in the presence of a chaperone     Assessment and Plan:    1. Abscess of left genital labia Spontaneously draining, healing well on its own. Recommended warm compresses, analgesia as needed Complete antibiotic therapy Cautioned about shaving in area as this likely arose from ingrown hair/folliculitis. Routine preventative health maintenance measures emphasized. Please refer to After Visit Summary for other counseling recommendations.   Return for any gynecologic concerns.    I spent 30 minutes dedicated to the care of this patient including pre-visit review of records, face to face time with the patient discussing her conditions and treatments and post visit orders.    Jaynie Collins, MD, FACOG Obstetrician & Gynecologist, [facility] for [employer], [facility]
